# Patient Record
Sex: Female | Born: 1965 | Race: Black or African American | Hispanic: No | Marital: Single | Smoking: Former smoker
Health system: Southern US, Community
[De-identification: ages and names within clinical notes are randomized; demographics above are authoritative.]

## PROBLEM LIST (undated history)

## (undated) DIAGNOSIS — G8929 Other chronic pain: Secondary | ICD-10-CM

## (undated) DIAGNOSIS — M25569 Pain in unspecified knee: Secondary | ICD-10-CM

## (undated) DIAGNOSIS — M1990 Unspecified osteoarthritis, unspecified site: Secondary | ICD-10-CM

## (undated) DIAGNOSIS — G43909 Migraine, unspecified, not intractable, without status migrainosus: Secondary | ICD-10-CM

## (undated) DIAGNOSIS — M549 Dorsalgia, unspecified: Secondary | ICD-10-CM

## (undated) DIAGNOSIS — M542 Cervicalgia: Secondary | ICD-10-CM

## (undated) DIAGNOSIS — M5430 Sciatica, unspecified side: Secondary | ICD-10-CM

## (undated) DIAGNOSIS — M25519 Pain in unspecified shoulder: Secondary | ICD-10-CM

## (undated) HISTORY — PX: LESION EXCISION: SHX5167

## (undated) HISTORY — PX: TUBAL LIGATION: SHX77

---

## 2005-02-15 ENCOUNTER — Emergency Department (HOSPITAL_COMMUNITY): Admission: EM | Admit: 2005-02-15 | Discharge: 2005-02-15 | Payer: Self-pay | Admitting: Emergency Medicine

## 2005-08-25 ENCOUNTER — Emergency Department (HOSPITAL_COMMUNITY): Admission: EM | Admit: 2005-08-25 | Discharge: 2005-08-26 | Payer: Self-pay | Admitting: Emergency Medicine

## 2006-01-01 ENCOUNTER — Emergency Department (HOSPITAL_COMMUNITY): Admission: EM | Admit: 2006-01-01 | Discharge: 2006-01-01 | Payer: Self-pay | Admitting: Emergency Medicine

## 2006-04-02 ENCOUNTER — Emergency Department (HOSPITAL_COMMUNITY): Admission: EM | Admit: 2006-04-02 | Discharge: 2006-04-02 | Payer: Self-pay | Admitting: Emergency Medicine

## 2006-04-04 ENCOUNTER — Emergency Department (HOSPITAL_COMMUNITY): Admission: EM | Admit: 2006-04-04 | Discharge: 2006-04-04 | Payer: Self-pay | Admitting: Emergency Medicine

## 2007-01-10 ENCOUNTER — Emergency Department (HOSPITAL_COMMUNITY): Admission: EM | Admit: 2007-01-10 | Discharge: 2007-01-10 | Payer: Self-pay | Admitting: Emergency Medicine

## 2007-06-05 ENCOUNTER — Other Ambulatory Visit: Admission: RE | Admit: 2007-06-05 | Discharge: 2007-06-05 | Payer: Self-pay | Admitting: Pediatrics

## 2007-06-22 ENCOUNTER — Encounter: Admission: RE | Admit: 2007-06-22 | Discharge: 2007-06-22 | Payer: Self-pay | Admitting: Family Medicine

## 2007-10-05 ENCOUNTER — Emergency Department (HOSPITAL_COMMUNITY): Admission: EM | Admit: 2007-10-05 | Discharge: 2007-10-06 | Payer: Self-pay | Admitting: Emergency Medicine

## 2007-10-23 ENCOUNTER — Encounter: Admission: RE | Admit: 2007-10-23 | Discharge: 2007-10-23 | Payer: Self-pay | Admitting: Otolaryngology

## 2008-04-28 ENCOUNTER — Emergency Department (HOSPITAL_COMMUNITY): Admission: EM | Admit: 2008-04-28 | Discharge: 2008-04-29 | Payer: Self-pay | Admitting: Emergency Medicine

## 2008-07-14 ENCOUNTER — Emergency Department (HOSPITAL_COMMUNITY): Admission: EM | Admit: 2008-07-14 | Discharge: 2008-07-14 | Payer: Self-pay | Admitting: Emergency Medicine

## 2008-09-25 ENCOUNTER — Emergency Department (HOSPITAL_COMMUNITY): Admission: EM | Admit: 2008-09-25 | Discharge: 2008-09-25 | Payer: Self-pay | Admitting: Emergency Medicine

## 2008-12-06 ENCOUNTER — Ambulatory Visit: Payer: Self-pay | Admitting: Family Medicine

## 2009-04-24 ENCOUNTER — Encounter: Admission: RE | Admit: 2009-04-24 | Discharge: 2009-04-24 | Payer: Self-pay | Admitting: Internal Medicine

## 2009-05-07 ENCOUNTER — Ambulatory Visit (HOSPITAL_COMMUNITY): Admission: RE | Admit: 2009-05-07 | Discharge: 2009-05-07 | Payer: Self-pay | Admitting: Internal Medicine

## 2010-04-05 ENCOUNTER — Encounter: Payer: Self-pay | Admitting: Internal Medicine

## 2010-04-08 ENCOUNTER — Other Ambulatory Visit: Payer: Self-pay | Admitting: Internal Medicine

## 2010-04-08 DIAGNOSIS — Z1239 Encounter for other screening for malignant neoplasm of breast: Secondary | ICD-10-CM

## 2010-04-28 ENCOUNTER — Ambulatory Visit: Payer: Self-pay

## 2010-05-12 ENCOUNTER — Ambulatory Visit
Admission: RE | Admit: 2010-05-12 | Discharge: 2010-05-12 | Disposition: A | Discharge status: Home / Self Care | Payer: BC Managed Care – PPO | Source: Ambulatory Visit | Attending: Internal Medicine | Admitting: Internal Medicine

## 2010-05-12 DIAGNOSIS — Z1239 Encounter for other screening for malignant neoplasm of breast: Secondary | ICD-10-CM

## 2010-06-21 LAB — URINALYSIS, ROUTINE W REFLEX MICROSCOPIC
Bilirubin Urine: NEGATIVE
Hgb urine dipstick: NEGATIVE
Specific Gravity, Urine: 1.02 (ref 1.005–1.030)
pH: 6 (ref 5.0–8.0)

## 2010-06-21 LAB — WET PREP, GENITAL: Yeast Wet Prep HPF POC: NONE SEEN

## 2010-06-21 LAB — CBC
HCT: 37.1 % (ref 36.0–46.0)
Hemoglobin: 12.2 g/dL (ref 12.0–15.0)
MCV: 81.1 fL (ref 78.0–100.0)
RBC: 4.58 MIL/uL (ref 3.87–5.11)
WBC: 3.6 10*3/uL — ABNORMAL LOW (ref 4.0–10.5)

## 2011-05-05 ENCOUNTER — Other Ambulatory Visit: Payer: Self-pay | Admitting: Internal Medicine

## 2011-05-05 DIAGNOSIS — Z1231 Encounter for screening mammogram for malignant neoplasm of breast: Secondary | ICD-10-CM

## 2011-05-12 ENCOUNTER — Ambulatory Visit
Admission: RE | Admit: 2011-05-12 | Discharge: 2011-05-12 | Disposition: A | Discharge status: Home / Self Care | Payer: BC Managed Care – PPO | Source: Ambulatory Visit | Attending: Internal Medicine | Admitting: Internal Medicine

## 2011-05-12 DIAGNOSIS — Z1231 Encounter for screening mammogram for malignant neoplasm of breast: Secondary | ICD-10-CM

## 2011-06-03 ENCOUNTER — Other Ambulatory Visit: Payer: Self-pay | Admitting: Orthopedic Surgery

## 2011-06-03 DIAGNOSIS — M25512 Pain in left shoulder: Secondary | ICD-10-CM

## 2011-06-11 ENCOUNTER — Inpatient Hospital Stay: Admission: RE | Admit: 2011-06-11 | Payer: BC Managed Care – PPO | Source: Ambulatory Visit

## 2011-06-26 ENCOUNTER — Other Ambulatory Visit: Payer: BC Managed Care – PPO

## 2012-05-12 ENCOUNTER — Other Ambulatory Visit: Payer: Self-pay

## 2012-05-12 DIAGNOSIS — Z1231 Encounter for screening mammogram for malignant neoplasm of breast: Secondary | ICD-10-CM

## 2012-05-15 ENCOUNTER — Ambulatory Visit: Payer: BC Managed Care – PPO

## 2012-05-16 ENCOUNTER — Ambulatory Visit: Payer: BC Managed Care – PPO

## 2012-05-29 ENCOUNTER — Ambulatory Visit: Payer: BC Managed Care – PPO

## 2012-06-02 ENCOUNTER — Ambulatory Visit (HOSPITAL_BASED_OUTPATIENT_CLINIC_OR_DEPARTMENT_OTHER): Payer: Medicaid Other | Attending: Internal Medicine

## 2012-06-02 ENCOUNTER — Ambulatory Visit
Admission: RE | Admit: 2012-06-02 | Discharge: 2012-06-02 | Disposition: A | Discharge status: Home / Self Care | Payer: Medicaid Other | Source: Ambulatory Visit | Attending: Internal Medicine | Admitting: Internal Medicine

## 2012-06-02 ENCOUNTER — Other Ambulatory Visit: Payer: Self-pay | Admitting: Family

## 2012-06-02 VITALS — Ht 65.0 in | Wt 220.0 lb

## 2012-06-02 DIAGNOSIS — R0989 Other specified symptoms and signs involving the circulatory and respiratory systems: Secondary | ICD-10-CM | POA: Insufficient documentation

## 2012-06-02 DIAGNOSIS — G4733 Obstructive sleep apnea (adult) (pediatric): Secondary | ICD-10-CM

## 2012-06-02 DIAGNOSIS — M25561 Pain in right knee: Secondary | ICD-10-CM

## 2012-06-02 DIAGNOSIS — G4710 Hypersomnia, unspecified: Secondary | ICD-10-CM | POA: Insufficient documentation

## 2012-06-02 DIAGNOSIS — R0609 Other forms of dyspnea: Secondary | ICD-10-CM | POA: Insufficient documentation

## 2012-06-02 DIAGNOSIS — G4730 Sleep apnea, unspecified: Secondary | ICD-10-CM | POA: Insufficient documentation

## 2012-06-06 NOTE — Procedures (Signed)
[name], Kayla [name]                 ACCOUNT NO.:  [id number]  MEDICAL RECORD NO.:  [id number]          PATIENT TYPE:  OUT  LOCATION:  SLEEP CENTER                 FACILITY:  [facility]  PHYSICIAN:  Khayree Delellis D. Maple Hudson, MD, FCCP, FACPDATE OF BIRTH:  01-16-67  DATE OF STUDY:  06/02/2012                           NOCTURNAL POLYSOMNOGRAM  REFERRING PHYSICIAN:  Merlene Laughter. Renae Gloss, M.D.  INDICATION FOR STUDY:  Hypersomnia with sleep apnea.  EPWORTH SLEEPINESS SCORE:  19/24.  BMI 36.6 per hour.  Weight 220 pounds.  Height 65 inches.  Neck 15.5 inches.  MEDICATIONS:  Home medications charted and reviewed.  SLEEP ARCHITECTURE:  Total sleep time 271 minutes with sleep efficiency 74.2%.  Stage I was 13.1%, stage II 67.2%, stage III 1.8%, REM 17.9% of total sleep time.  Sleep latency 25 minutes.  REM latency 71.5 minutes, awake after sleep onset 71 minutes.  Arousal index 12.6.  BEDTIME MEDICATION:  None.  RESPIRATORY DATA:  Apnea-hypopnea index (AHI) 3.5 per hour.  A total of 16 events was scored including 7 obstructive apneas, 2 central apneas, 7 hypopneas.  Events were not positional.  REM-AHI 18.6 per hour.  There were not enough events to qualify for split CPAP titration protocol on this study night.  OXYGEN DATA:  Moderate snoring with oxygen desaturation to a nadir of 89% and mean oxygen saturation through the study of 97.2% on room air.  CARDIAC DATA:  Normal sinus rhythm.  MOVEMENT-PARASOMNIA:  No significant movement disturbance.  Bathroom x2.  IMPRESSIONS-RECOMMENDATIONS: 1. Unremarkable sleep architecture for sleep center environment. 2. Occasional respiratory events with sleep disturbance, within normal     limits.  AHI 3.5 per hour (the normal range for adults is from 0-5     per hour).  Moderate snoring with oxygen desaturation to a nadir of     89% and     mean oxygen saturation through the study of 97.2% on room air.     There were not enough events to meet protocol  requirements for CPAP     trial.     Landrey Mahurin D. Maple Hudson, MD, [facility], FACP Diplomate, American Board of Sleep Medicine    CDY/MEDQ  D:  06/04/2012 12:56:55  T:  06/04/2012 17:17:05  Job:  [id number]

## 2012-07-04 ENCOUNTER — Ambulatory Visit
Admission: RE | Admit: 2012-07-04 | Discharge: 2012-07-04 | Disposition: A | Discharge status: Home / Self Care | Payer: Medicaid Other | Source: Ambulatory Visit

## 2012-07-04 DIAGNOSIS — Z1231 Encounter for screening mammogram for malignant neoplasm of breast: Secondary | ICD-10-CM

## 2012-07-24 ENCOUNTER — Ambulatory Visit: Payer: Medicaid Other

## 2012-08-15 ENCOUNTER — Other Ambulatory Visit: Payer: Self-pay | Admitting: Gastroenterology

## 2012-08-15 DIAGNOSIS — R109 Unspecified abdominal pain: Secondary | ICD-10-CM

## 2012-08-15 DIAGNOSIS — R112 Nausea with vomiting, unspecified: Secondary | ICD-10-CM

## 2012-08-28 ENCOUNTER — Encounter (HOSPITAL_COMMUNITY): Payer: Medicaid Other

## 2012-08-29 ENCOUNTER — Ambulatory Visit (HOSPITAL_COMMUNITY)
Admission: RE | Admit: 2012-08-29 | Discharge: 2012-08-29 | Disposition: A | Discharge status: Home / Self Care | Payer: Medicaid Other | Source: Ambulatory Visit | Attending: Gastroenterology | Admitting: Gastroenterology

## 2012-08-29 ENCOUNTER — Encounter (HOSPITAL_COMMUNITY): Payer: Medicaid Other

## 2012-08-29 DIAGNOSIS — R109 Unspecified abdominal pain: Secondary | ICD-10-CM

## 2012-08-29 DIAGNOSIS — R112 Nausea with vomiting, unspecified: Secondary | ICD-10-CM

## 2012-09-06 ENCOUNTER — Encounter (HOSPITAL_COMMUNITY)
Admission: RE | Admit: 2012-09-06 | Discharge: 2012-09-06 | Disposition: A | Discharge status: Home / Self Care | Payer: Medicaid Other | Source: Ambulatory Visit | Attending: Gastroenterology | Admitting: Gastroenterology

## 2012-09-06 ENCOUNTER — Ambulatory Visit (HOSPITAL_COMMUNITY)
Admission: RE | Admit: 2012-09-06 | Discharge: 2012-09-06 | Disposition: A | Discharge status: Home / Self Care | Payer: Medicaid Other | Source: Ambulatory Visit | Attending: Gastroenterology | Admitting: Gastroenterology

## 2012-09-06 DIAGNOSIS — R112 Nausea with vomiting, unspecified: Secondary | ICD-10-CM

## 2012-09-06 DIAGNOSIS — R109 Unspecified abdominal pain: Secondary | ICD-10-CM | POA: Insufficient documentation

## 2012-09-06 DIAGNOSIS — N289 Disorder of kidney and ureter, unspecified: Secondary | ICD-10-CM | POA: Insufficient documentation

## 2012-09-06 MED ORDER — SINCALIDE 5 MCG IJ SOLR
INTRAMUSCULAR | Status: AC
  Filled 2012-09-06: qty 5

## 2012-09-06 MED ORDER — SINCALIDE 5 MCG IJ SOLR
0.0200 ug/kg | Freq: Once | INTRAMUSCULAR | Status: AC
  Administered 2012-09-06: 1.97 ug via INTRAVENOUS

## 2012-09-06 MED ORDER — TECHNETIUM TC 99M MEBROFENIN IV KIT
5.0000 | PACK | Freq: Once | INTRAVENOUS | Status: AC | PRN
  Administered 2012-09-06: 5 via INTRAVENOUS

## 2012-09-15 ENCOUNTER — Encounter (HOSPITAL_COMMUNITY): Payer: Self-pay | Admitting: *Deleted

## 2012-09-15 ENCOUNTER — Emergency Department (HOSPITAL_COMMUNITY)
Admission: EM | Admit: 2012-09-15 | Discharge: 2012-09-15 | Discharge status: Against Medical Advice | Payer: Medicaid Other | Attending: Emergency Medicine | Admitting: Emergency Medicine

## 2012-09-15 DIAGNOSIS — M25519 Pain in unspecified shoulder: Secondary | ICD-10-CM | POA: Insufficient documentation

## 2012-09-15 DIAGNOSIS — M542 Cervicalgia: Secondary | ICD-10-CM | POA: Insufficient documentation

## 2012-09-15 DIAGNOSIS — M25529 Pain in unspecified elbow: Secondary | ICD-10-CM | POA: Insufficient documentation

## 2012-09-15 DIAGNOSIS — G8929 Other chronic pain: Secondary | ICD-10-CM | POA: Insufficient documentation

## 2012-09-15 HISTORY — DX: Other chronic pain: G89.29

## 2012-09-15 HISTORY — DX: Unspecified osteoarthritis, unspecified site: M19.90

## 2012-09-15 HISTORY — DX: Migraine, unspecified, not intractable, without status migrainosus: G43.909

## 2012-09-15 HISTORY — DX: Pain in unspecified knee: M25.569

## 2012-09-15 HISTORY — DX: Pain in unspecified shoulder: M25.519

## 2012-09-15 NOTE — ED Notes (Signed)
Pt states has chronic pain in shoulders and arms, states past 3 days has had severe L shoulder/upper back area/arm pain 10/10.

## 2012-09-17 ENCOUNTER — Emergency Department (HOSPITAL_COMMUNITY)
Admission: EM | Admit: 2012-09-17 | Discharge: 2012-09-17 | Disposition: A | Discharge status: Home / Self Care | Payer: Medicaid Other | Attending: Emergency Medicine | Admitting: Emergency Medicine

## 2012-09-17 ENCOUNTER — Emergency Department (HOSPITAL_COMMUNITY): Payer: Medicaid Other

## 2012-09-17 ENCOUNTER — Encounter (HOSPITAL_COMMUNITY): Payer: Self-pay | Admitting: Emergency Medicine

## 2012-09-17 DIAGNOSIS — M25519 Pain in unspecified shoulder: Secondary | ICD-10-CM | POA: Insufficient documentation

## 2012-09-17 DIAGNOSIS — Z8679 Personal history of other diseases of the circulatory system: Secondary | ICD-10-CM | POA: Insufficient documentation

## 2012-09-17 DIAGNOSIS — G8929 Other chronic pain: Secondary | ICD-10-CM | POA: Insufficient documentation

## 2012-09-17 DIAGNOSIS — S5010XA Contusion of unspecified forearm, initial encounter: Secondary | ICD-10-CM | POA: Insufficient documentation

## 2012-09-17 DIAGNOSIS — X500XXA Overexertion from strenuous movement or load, initial encounter: Secondary | ICD-10-CM | POA: Insufficient documentation

## 2012-09-17 DIAGNOSIS — Z79899 Other long term (current) drug therapy: Secondary | ICD-10-CM | POA: Insufficient documentation

## 2012-09-17 DIAGNOSIS — Z8739 Personal history of other diseases of the musculoskeletal system and connective tissue: Secondary | ICD-10-CM | POA: Insufficient documentation

## 2012-09-17 DIAGNOSIS — M25512 Pain in left shoulder: Secondary | ICD-10-CM

## 2012-09-17 DIAGNOSIS — Y9389 Activity, other specified: Secondary | ICD-10-CM | POA: Insufficient documentation

## 2012-09-17 DIAGNOSIS — Y929 Unspecified place or not applicable: Secondary | ICD-10-CM | POA: Insufficient documentation

## 2012-09-17 DIAGNOSIS — IMO0002 Reserved for concepts with insufficient information to code with codable children: Secondary | ICD-10-CM | POA: Insufficient documentation

## 2012-09-17 DIAGNOSIS — M129 Arthropathy, unspecified: Secondary | ICD-10-CM | POA: Insufficient documentation

## 2012-09-17 DIAGNOSIS — T148XXA Other injury of unspecified body region, initial encounter: Secondary | ICD-10-CM

## 2012-09-17 DIAGNOSIS — R58 Hemorrhage, not elsewhere classified: Secondary | ICD-10-CM

## 2012-09-17 HISTORY — DX: Sciatica, unspecified side: M54.30

## 2012-09-17 HISTORY — DX: Cervicalgia: M54.2

## 2012-09-17 HISTORY — DX: Dorsalgia, unspecified: M54.9

## 2012-09-17 HISTORY — DX: Other chronic pain: G89.29

## 2012-09-17 MED ORDER — METHOCARBAMOL 500 MG PO TABS: 1000.0000 mg | ORAL_TABLET | Freq: Four times a day (QID) | ORAL | Status: DC | PRN

## 2012-09-17 MED ORDER — NAPROXEN 250 MG PO TABS: 250.0000 mg | ORAL_TABLET | Freq: Two times a day (BID) | ORAL | Status: DC

## 2012-09-17 MED ORDER — HYDROCODONE-ACETAMINOPHEN 5-325 MG PO TABS: ORAL_TABLET | ORAL | Status: DC

## 2012-09-17 MED ORDER — IBUPROFEN 200 MG PO TABS
400.0000 mg | ORAL_TABLET | Freq: Once | ORAL | Status: AC
  Administered 2012-09-17: 400 mg via ORAL
  Filled 2012-09-17: qty 2

## 2012-09-17 MED ORDER — HYDROCODONE-ACETAMINOPHEN 5-325 MG PO TABS
1.0000 | ORAL_TABLET | Freq: Once | ORAL | Status: AC
  Administered 2012-09-17: 1 via ORAL
  Filled 2012-09-17: qty 1
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
## 2012-10-06 ENCOUNTER — Other Ambulatory Visit: Payer: Self-pay | Admitting: Internal Medicine

## 2012-10-06 DIAGNOSIS — N289 Disorder of kidney and ureter, unspecified: Secondary | ICD-10-CM

## 2012-10-12 ENCOUNTER — Ambulatory Visit
Admission: RE | Admit: 2012-10-12 | Discharge: 2012-10-12 | Disposition: A | Discharge status: Home / Self Care | Payer: Medicaid Other | Source: Ambulatory Visit | Attending: Internal Medicine | Admitting: Internal Medicine

## 2012-10-12 DIAGNOSIS — N289 Disorder of kidney and ureter, unspecified: Secondary | ICD-10-CM

## 2012-10-12 MED ORDER — GADOBENATE DIMEGLUMINE 529 MG/ML IV SOLN
19.0000 mL | Freq: Once | INTRAVENOUS | Status: AC | PRN
  Administered 2012-10-12: 19 mL via INTRAVENOUS

## 2012-11-09 ENCOUNTER — Other Ambulatory Visit: Payer: Self-pay | Admitting: Gastroenterology

## 2012-11-09 DIAGNOSIS — R112 Nausea with vomiting, unspecified: Secondary | ICD-10-CM

## 2012-11-28 ENCOUNTER — Emergency Department (HOSPITAL_COMMUNITY): Payer: Medicaid Other

## 2012-11-28 ENCOUNTER — Emergency Department (HOSPITAL_COMMUNITY)
Admission: EM | Admit: 2012-11-28 | Discharge: 2012-11-28 | Disposition: A | Discharge status: Home / Self Care | Payer: Medicaid Other | Attending: Emergency Medicine | Admitting: Emergency Medicine

## 2012-11-28 ENCOUNTER — Encounter (HOSPITAL_COMMUNITY): Payer: Self-pay | Admitting: Emergency Medicine

## 2012-11-28 DIAGNOSIS — Z8679 Personal history of other diseases of the circulatory system: Secondary | ICD-10-CM | POA: Insufficient documentation

## 2012-11-28 DIAGNOSIS — Y929 Unspecified place or not applicable: Secondary | ICD-10-CM | POA: Insufficient documentation

## 2012-11-28 DIAGNOSIS — Y939 Activity, unspecified: Secondary | ICD-10-CM | POA: Insufficient documentation

## 2012-11-28 DIAGNOSIS — Z23 Encounter for immunization: Secondary | ICD-10-CM | POA: Insufficient documentation

## 2012-11-28 DIAGNOSIS — M129 Arthropathy, unspecified: Secondary | ICD-10-CM | POA: Insufficient documentation

## 2012-11-28 DIAGNOSIS — Z79899 Other long term (current) drug therapy: Secondary | ICD-10-CM | POA: Insufficient documentation

## 2012-11-28 DIAGNOSIS — S61209A Unspecified open wound of unspecified finger without damage to nail, initial encounter: Secondary | ICD-10-CM | POA: Insufficient documentation

## 2012-11-28 DIAGNOSIS — W540XXA Bitten by dog, initial encounter: Secondary | ICD-10-CM | POA: Insufficient documentation

## 2012-11-28 MED ORDER — TETANUS-DIPHTH-ACELL PERTUSSIS 5-2.5-18.5 LF-MCG/0.5 IM SUSP
0.5000 mL | Freq: Once | INTRAMUSCULAR | Status: AC
  Administered 2012-11-28: 0.5 mL via INTRAMUSCULAR
  Filled 2012-11-28: qty 0.5

## 2012-11-28 MED ORDER — AMOXICILLIN-POT CLAVULANATE 875-125 MG PO TABS: 1.0000 | ORAL_TABLET | Freq: Two times a day (BID) | ORAL | Status: DC

## 2012-11-28 NOTE — ED Notes (Signed)
Patient is alert and oriented x3.  She was given DC instructions and follow up visit instructions.  Patient gave verbal understanding. She was DC ambulatory under her own power to home.  V/S stable.  He was not showing any signs of distress on DC 

## 2012-11-28 NOTE — ED Provider Notes (Signed)
CSN: [id number]     Arrival date & time 11/28/12  1753 History  This chart was scribed for non-physician practitioner, Magnus Sinning, PA-C working with Junius Argyle, MD by Greggory Stallion, ED scribe. This patient was seen in room WTR5/WTR5 and the patient's care was started at 7:25 PM.   Chief Complaint  Patient presents with  . Animal Bite   The history is provided by the patient. No language interpreter was used.    HPI Comments: Kayla [name] is a 47 y.o. female who presents to the Emergency Department complaining of a dog bite to her right middle and index fingers that happened about an hour prior to arrival. She reports that she washed the area with soap and water.  She also washed the area with Hydrogen Peroxide.  She states she also fell trying to get away from the dog and bent her right thumb backwards. She states the dog is her's and it's shots are UTD. She denies any other associated symptoms. Pt states her last tetanus was 25 years ago.   Past Medical History  Diagnosis Date  . Chronic shoulder pain     left  . Arthritis   . Migraine   . Knee pain   . Chronic neck pain   . Chronic back pain   . Sciatica    Past Surgical History  Procedure Laterality Date  . Cesarean section    . Tubal ligation     No family history on file. History  Substance Use Topics  . Smoking status: Never Smoker   . Smokeless tobacco: Never Used  . Alcohol Use: Yes   OB History   Grav Para Term Preterm Abortions TAB SAB Ect Mult Living                 Review of Systems  Skin: Positive for wound.  All other systems reviewed and are negative.    Allergies  Oxycodone  Home Medications   Current Outpatient Rx  Name  Route  Sig  Dispense  Refill  . acetaminophen (TYLENOL) 500 MG tablet   Oral   Take 500 mg by mouth every 6 (six) hours as needed for pain.         . cetirizine (ZYRTEC) 10 MG tablet   Oral   Take 10 mg by mouth daily.         . cholecalciferol (VITAMIN  D) 1000 UNITS tablet   Oral   Take 1,000 Units by mouth daily.         . DULoxetine (CYMBALTA) 30 MG capsule   Oral   Take 30 mg by mouth daily.         Marland Kitchen HYDROcodone-acetaminophen (NORCO/VICODIN) 5-325 MG per tablet      1 or 2 tabs PO q6 hours prn pain   20 tablet   0   . methocarbamol (ROBAXIN) 500 MG tablet   Oral   Take 2 tablets (1,000 mg total) by mouth 4 (four) times daily as needed (muscle spasm/pain).   25 tablet   0   . naproxen (NAPROSYN) 250 MG tablet   Oral   Take 1 tablet (250 mg total) by mouth 2 (two) times daily with a meal.   14 tablet   0   . NON FORMULARY   Subcutaneous   Inject 2 vials into the skin 3 (three) times a week. Allergy shots          BP 125/83  Pulse 72  Temp(Src)  98.5 F (36.9 C) (Oral)  Resp 20  SpO2 98%  Physical Exam  Nursing note and vitals reviewed. Constitutional: She appears well-developed and well-nourished.  HENT:  Head: Normocephalic and atraumatic.  Mouth/Throat: Oropharynx is clear and moist.  Eyes: EOM are normal. Pupils are equal, round, and reactive to light.  Neck: Normal range of motion. Neck supple.  Cardiovascular: Normal rate, regular rhythm and normal heart sounds.   2+ radial pulse.   Pulmonary/Chest: Effort normal and breath sounds normal. No respiratory distress. She has no wheezes. She has no rales.  Musculoskeletal: Normal range of motion.  Full ROM of fingers on both hands.   Neurological: She is alert.  Sensation intact.   Skin: Skin is warm and dry.  Superficial puncture wounds to right middle finger. No surrounding erythema or edema. No drainage at this time. Bleeding is controlled at this time.   Psychiatric: She has a normal mood and affect. Her behavior is normal.    ED Course  Procedures (including critical care time)  DIAGNOSTIC STUDIES: Oxygen Saturation is 98% on RA, normal by my interpretation.    COORDINATION OF CARE: 7:29 PM-Discussed treatment plan which includes updating  tetanus and antibiotic with pt at bedside and pt agreed to plan.   Labs Review Labs Reviewed - No data to display Imaging Review Dg Hand Complete Left  11/28/2012   CLINICAL DATA:  History of fall complaining of left hand pain.  EXAM: LEFT HAND - COMPLETE 3+ VIEW  COMPARISON:  None.  FINDINGS: Three views of the left hand demonstrate no acute displaced fracture, subluxation, dislocation, joint or soft tissue abnormality.  IMPRESSION: No acute radiographic abnormality of the left hand.   Electronically Signed   By: Trudie Reed M.D.   On: 11/28/2012 19:30    MDM  No diagnosis found. Patient presenting with dog bite of the left hand.  Patient with superficial puncture wounds.  Areas cleaned well.  Tetanus updated.  Xray negative.  Patient reports that the dog's immunizations are UTD.  Patient stable for discharge.  Patient started on Augmentin to prevent infection.  Return precautions given.  I personally performed the services described in this documentation, which was scribed in my presence. The recorded information has been reviewed and is accurate.    Pascal Lux [place], PA-C 11/28/12 2004

## 2012-11-28 NOTE — ED Notes (Signed)
Pt states she was bite by her dog on right middle finger and left index finger and also fell trying to get away from dog and bent right thumb.

## 2012-11-29 NOTE — ED Provider Notes (Signed)
Medical screening examination/treatment/procedure(s) were performed by non-physician practitioner and as supervising physician I was immediately available for consultation/collaboration.   Junius Argyle, MD 11/29/12 1224

## 2012-11-30 ENCOUNTER — Ambulatory Visit (HOSPITAL_COMMUNITY)
Admission: RE | Admit: 2012-11-30 | Discharge: 2012-11-30 | Disposition: A | Discharge status: Home / Self Care | Payer: Medicaid Other | Source: Ambulatory Visit | Attending: Gastroenterology | Admitting: Gastroenterology

## 2012-11-30 DIAGNOSIS — R141 Gas pain: Secondary | ICD-10-CM | POA: Insufficient documentation

## 2012-11-30 DIAGNOSIS — R6881 Early satiety: Secondary | ICD-10-CM | POA: Insufficient documentation

## 2012-11-30 DIAGNOSIS — K3184 Gastroparesis: Secondary | ICD-10-CM | POA: Insufficient documentation

## 2012-11-30 DIAGNOSIS — K219 Gastro-esophageal reflux disease without esophagitis: Secondary | ICD-10-CM | POA: Insufficient documentation

## 2012-11-30 DIAGNOSIS — R142 Eructation: Secondary | ICD-10-CM | POA: Insufficient documentation

## 2012-11-30 DIAGNOSIS — R112 Nausea with vomiting, unspecified: Secondary | ICD-10-CM | POA: Insufficient documentation

## 2012-11-30 MED ORDER — TECHNETIUM TC 99M SULFUR COLLOID
2.1000 | Freq: Once | INTRAVENOUS | Status: AC | PRN
  Administered 2012-11-30: 2.1 via INTRAVENOUS

## 2013-01-24 ENCOUNTER — Ambulatory Visit (INDEPENDENT_AMBULATORY_CARE_PROVIDER_SITE_OTHER): Payer: Medicaid Other

## 2013-01-24 VITALS — BP 124/73 | HR 71 | Resp 16 | Ht 65.0 in | Wt 210.0 lb

## 2013-01-24 DIAGNOSIS — M7751 Other enthesopathy of right foot: Secondary | ICD-10-CM

## 2013-01-24 DIAGNOSIS — R52 Pain, unspecified: Secondary | ICD-10-CM

## 2013-01-24 DIAGNOSIS — B070 Plantar wart: Secondary | ICD-10-CM

## 2013-01-24 DIAGNOSIS — M7750 Other enthesopathy of unspecified foot: Secondary | ICD-10-CM

## 2013-01-24 DIAGNOSIS — D492 Neoplasm of unspecified behavior of bone, soft tissue, and skin: Secondary | ICD-10-CM

## 2013-01-24 DIAGNOSIS — Q828 Other specified congenital malformations of skin: Secondary | ICD-10-CM

## 2013-01-24 NOTE — Patient Instructions (Signed)
Warts Warts are a common viral infection. They are most commonly caused by the human papillomavirus (HPV). Warts can occur at all ages. However, they occur most frequently in older children and infrequently in the elderly. Warts may be single or multiple. Location and size varies. Warts can be spread by scratching the wart and then scratching normal skin. The life cycle of warts varies. However, most will disappear over many months to a couple years. Warts commonly do not cause problems (asymptomatic) unless they are over an area of pressure, such as the bottom of the foot. If they are large enough, they may cause pain with walking. DIAGNOSIS  Warts are most commonly diagnosed by their appearance. Tissue samples (biopsies) are not required unless the wart looks abnormal. Most warts have a rough surface, are round, oval, or irregular, and are skin-colored to light yellow, brown, or gray. They are generally less than  inch (1.3 cm), but they can be any size. TREATMENT   Observation or no treatment.  Freezing with liquid nitrogen.  High heat (cautery).  Boosting the body's immunity to fight off the wart (immunotherapy using Candida antigen).  Laser surgery.  Application of various irritants and solutions. HOME CARE INSTRUCTIONS  Follow your caregiver's instructions. No special precautions are necessary. Often, treatment may be followed by a return (recurrence) of warts. Warts are generally difficult to treat and get rid of. If treatment is done in a clinic setting, usually more than 1 treatment is required. This is usually done on only a monthly basis until the wart is completely gone. SEEK IMMEDIATE MEDICAL CARE IF: The treated skin becomes red, puffy (swollen), or painful. Document Released: 12/09/2004 Document Revised: 06/26/2012 Document Reviewed: 06/06/2009 [employer] Patient Information 2014 [employer].  

## 2013-01-24 NOTE — Progress Notes (Signed)
Subjective:    Patient ID: Kayla [name], female    DOB: [birth year], 47 y.o.   MRN: [id number] "I have pain on my right foot on the side.  I think it's a spur.  I have spots on the bottom of my foot.  I have this place on the bottom of my left foot that bothers me.  It has a core in it."   Toe Pain  The incident occurred more than 1 week ago. There was no injury mechanism. The pain is present in the right foot. The quality of the pain is described as burning and aching. The pain is at a severity of 7/10. The pain is moderate. The pain has been intermittent since onset. Associated symptoms include an inability to bear weight. She reports no foreign bodies present. The symptoms are aggravated by weight bearing (shoes). She has tried acetaminophen for the symptoms. The treatment provided no relief.      Review of Systems  Constitutional: Positive for activity change, appetite change and unexpected weight change.  HENT: Positive for postnasal drip, sinus pressure and sneezing.   Eyes: Negative.   Respiratory: Positive for apnea.   Cardiovascular: Negative.   Gastrointestinal: Positive for nausea, vomiting, abdominal pain, diarrhea, constipation and abdominal distention.  Endocrine: Positive for cold intolerance.  Genitourinary: Negative.   Musculoskeletal: Positive for neck pain and neck stiffness.  Skin: Negative.   Allergic/Immunologic: Positive for environmental allergies.  Neurological: Positive for headaches.  Hematological: Bruises/bleeds easily.  Psychiatric/Behavioral: Negative.        Objective:   Physical Exam Neurovascular status is intact with pedal pulses palpable DP +2/4 PT +2/4 bilateral. Skin temperature warm turgor normal no edema rubor pallor or varicosities noted. Capillary refill time 3 seconds all digits. Patient does have some digital contractures hammertoes 2 through 5 bilateral orthopedic biomechanical exam reveals some digital contractures patient does have on x-rays  right foot hallux is rectus sesamoid position 0 no spurring slight inferior calcaneal spur noted no retrocalcaneal spurring lesser digits show adductovarus rotated contractures there is some calcifications in the anterior shin on the right about 3 inches above the ankle mortise. No signs of fracture or other osseous abnormalities noted. A dermatologic exam patient has a nucleated keratotic lesion mid arch of left foot consistent with a verruca plantaris or poor keratoses. Painful on direct lateral compression. No discharge or drainage is noted. On the right foot patient is concerned about multiple pigmented lesions measuring from 2 mm to greater than a centimeter in diameter inferior right forefoot at the second third and fourth metatarsal areas. A small lesion also noted on the third digit distal tuft as well. Patient cases lesions were unnoticed prior to several months ago. Patient cases they are not long-standing birthmarks. Patient expresses concern about him being melanoma type or cancer lesions. Per patient request my recommendation at this time we'll arrange for excision and biopsy.       Assessment & Plan:  Assessment this time is verruca plantaris left foot patient is dispensed written instructions for topical salicylic acid application under occlusion utilizing.tape apply a salicylic acid daily for 7-14 day regimen.  Assessment on the right foot is possible unspecified neoplasm of skin cannot rule out melanoma at this time. The lesions have likely been there less than 6 months or year patient is uncertain of any changes. At this time consent form was reviewed for excision of skin lesions for biopsy purposes will do either excisional or incisional biopsies at least 3 of  the lesions mentioned. Patient is amendable to these understands that we'll do an excision with possible closure on the largest lesion and possible punch biopsy on the distal tuft of the third digit right foot. The procedural be done  in the office under local anesthetic block within the next week. All questions asked medication are answered or no contraindications to the procedure as patient is intact neurovascular status in the biopsies are scheduled.  Alvan Dame DPM

## 2013-01-29 ENCOUNTER — Ambulatory Visit (INDEPENDENT_AMBULATORY_CARE_PROVIDER_SITE_OTHER): Payer: Medicaid Other

## 2013-01-29 DIAGNOSIS — D492 Neoplasm of unspecified behavior of bone, soft tissue, and skin: Secondary | ICD-10-CM

## 2013-01-29 NOTE — Progress Notes (Signed)
Subjective:    Patient ID: Kayla [name], female    DOB: 08-Nov-1965, 47 y.o.   MRN: [id number]  HPI patient presents for surgical excision of 3 suspect pigmented unspecified neoplasms of skin right foot    Review of Systems  Constitutional: Negative.   HENT: Negative.   Eyes: Negative.   Respiratory: Negative.   Cardiovascular: Negative.   Genitourinary: Negative.   Musculoskeletal: Negative.   Skin: Negative.   Allergic/Immunologic: Negative.   Neurological: Negative.   Hematological: Negative.   Psychiatric/Behavioral: Negative.   All other systems reviewed and are negative.   deferred at this time     Objective:   Physical Exam  Constitutional: She appears well-developed and well-nourished.  Cardiovascular:  Pulses:      Dorsalis pedis pulses are 2+ on the right side, and 2+ on the left side.       Posterior tibial pulses are 2+ on the left side.  Capillary refill 3 seconds all digits. Skin temperature warm no edema rubor pallor or varicosities noted to  Musculoskeletal: Normal range of motion.  Rectus foot type orthopedic biomechanical exam unremarkable noncontributory  Neurological: She is alert. She has normal reflexes.  Epicritic and proprioceptive sensations intact and symmetric bilateral normal plantar response and DTRs noted  Skin: Skin is warm and dry. No cyanosis. Nails show no clubbing.  Skin color pigment normal hair growth absent.  3 suspect lesions and she several small lesions also present plantar aspect right foot lesion been present for a period of posse less than 6 months they show irregular borders and shakes with very shades of hyperpigmentation the largest lesion sub-third metatarsal plantar forefoot a second lesion is subsecond metatarsal area. Just proximal to the digital sulcus. The third lesion is on the distal tuft of the third digit less than 2 mm in diameter the largest lesion is approximately a centimeter or more in length and a half centimeter in  width. Lesions are not painful not raised harm in present for less than six-month and are of concern to the patient to  Psychiatric: She has a normal mood and affect. Her behavior is normal.          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 Total of 3 lesions will be submitted for pathology Anesthesia: Local anesthetic block total of 3 cc 50-50 mixture 2% Xylocaine plain/0.5% Marcaine with epi 1-100,000. Hemostasis: Appendectomy with local and right ankle tourniquet at 250 mm mercury Estimated blood loss: Minimal less than 1 cc Indications for surgery: Patient has proximally 6 months or less history of new pigmented lesions plantar aspect of right foot least 3 a larger or more notable lesions are to be biopsied at this time due to patient's concern for melanoma. The lesions have not been noted prior to 6 months ago and apparently have grown, or possibly changed color. This certainly makes him suspicious for melanoma. Findings and procedures: Patient was brought to the OR and placed on table in supine position. In the preop treatment room local anesthetic block was listed utilizing total of 3 cc of the 50-50 mixture of local block. Once on the table the patient was prepped and draped in usual aseptic manner Betadine prep was performed the right foot was exsanguinated via an Ace wrap. Ankle tourniquet  was inflated to 250 mm mercury and the following procedures were then carried out.  Procedure #1 Excision of lesion sub-third right.  Attention was directed the plantar aspect of the right foot sub-third metatarsal area. The greater than 1 cm by half centimeter lesion was identified and 2 somewhat semi-elliptical longitudinal  incisions were made encompassing the lesions. Lesions were made down to subcutaneous tissue level and the incision and lesion were excised from the site in toto. The specimen was submitted in formalin for pathology. The base of lesion was cleared of any fatty necrotic tissue no pigmentation extended down to subcutaneous level. The edges were undermined and closure was accomplished utilizing 5-0 nylon in a simple interrupted fashion. Procedure #2:3 mm punch biopsy subsecond met right  Attention was made to the pigmented lesion beneath the second metatarsal area. The lesion has irregular border with varying shades of pigmentation. At this time a 3 mm punch biopsy was utilized and full thickness epidermal dermal sample was obtained from an area of the periphery of the lesion. This was also submitted in formalin for pathology analysis. A single suture was utilized to close the punch biopsy site. Procedure #3: 3 mm punch biopsy third toe right  Attention was directed to the plantar distal tuft third digit right foot where approximately 2 mm pigmented lesion was identified. The punch biopsy 3 mm was utilized to obtain a full-thickness sample epidermal and dermal of this lesion. Once excised a 5-0 nylon suture was utilized to close lesion.  Upon completion of all 3 excisions/biopsies. The sites were prepped with Betadine saline sponge and a dry sterile gauze dressing then applied to the right foot. Ankle tourniquet was deflated with immediate return of perfusion to all digits. Patient was discharged with [facility] postop instructions. Prescription for pain antibiotic medication and appointment for followup offices within one week. Lesions were submitted to be collapse for pathology analysis.  Alvan Dame DPM

## 2013-01-29 NOTE — Patient Instructions (Signed)

## 2013-02-06 ENCOUNTER — Ambulatory Visit (INDEPENDENT_AMBULATORY_CARE_PROVIDER_SITE_OTHER): Payer: Medicaid Other

## 2013-02-06 VITALS — BP 110/65 | HR 67 | Temp 98.6°F | Resp 20

## 2013-02-06 DIAGNOSIS — D229 Melanocytic nevi, unspecified: Secondary | ICD-10-CM

## 2013-02-06 DIAGNOSIS — Z09 Encounter for follow-up examination after completed treatment for conditions other than malignant neoplasm: Secondary | ICD-10-CM

## 2013-02-06 DIAGNOSIS — D239 Other benign neoplasm of skin, unspecified: Secondary | ICD-10-CM

## 2013-02-06 DIAGNOSIS — D492 Neoplasm of unspecified behavior of bone, soft tissue, and skin: Secondary | ICD-10-CM

## 2013-02-06 NOTE — Progress Notes (Signed)
  Subjective:    Patient ID: Kayla [name], female    DOB: 01-09-1966, 47 y.o.   MRN: [id number] "It's doing alright."  HPI    Review of Systems deferred     Objective:   Physical Exam Patient presents this time to 8 day status post excision biopsy 3 lesions plantar right foot. 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 Patient having minimal discomfort dressings intact and dry.       Assessment & Plan:  Assessment good postop progress all 3 lesions were noted to be benign. Presto compressive dressing reapplied at this time reappointed one week plan for stitch removal at next visit.  Alvan Dame DP

## 2013-02-06 NOTE — Patient Instructions (Signed)

## 2013-02-13 ENCOUNTER — Ambulatory Visit (INDEPENDENT_AMBULATORY_CARE_PROVIDER_SITE_OTHER): Payer: Medicaid Other

## 2013-02-13 VITALS — BP 124/74 | HR 64 | Resp 12

## 2013-02-13 DIAGNOSIS — D239 Other benign neoplasm of skin, unspecified: Secondary | ICD-10-CM

## 2013-02-13 DIAGNOSIS — Z09 Encounter for follow-up examination after completed treatment for conditions other than malignant neoplasm: Secondary | ICD-10-CM

## 2013-02-13 DIAGNOSIS — D229 Melanocytic nevi, unspecified: Secondary | ICD-10-CM

## 2013-02-13 NOTE — Progress Notes (Signed)
   Subjective:    Patient ID: Kayla [name], female    DOB: 1965/08/27, 47 y.o.   MRN: [id number]  HPI patient presents this time a 15 day status post excision multiple lesions for biopsy plantar aspect right foot pathology confirmed melanocytic nevus benign lesions were noted   Review of Systems deferred     Objective:   Physical Exam Neurovascular status is intact incision is well coapted and healed no dehiscence noted. Sutures removed at this time. Patient may resume normal bathing and hygiene starting tomorrow at this time Neosporin and Band-Aid applied to the plantar lesion sub-for patient will be recheck in 2 months for possible long-term followup       Assessment & Plan:  Assessment good postop progress no dehiscence no signs of infection maintain Neosporin and Band-Aid on for couple of days may discontinue surgical shoe may resume normal bathing and hygiene normal activities followup in 2 months for possible long-term recheck postop.  Alvan Dame DPM

## 2013-02-13 NOTE — Patient Instructions (Signed)

## 2013-03-05 ENCOUNTER — Telehealth: Payer: Self-pay | Admitting: *Deleted

## 2013-03-05 NOTE — Telephone Encounter (Signed)
Pt states had surgery with Dr Ralene Cork nn/17/2014 and continues to have burning pain and hardness in the biopsy area.  What should she do?  I referred pt to scheduler.

## 2013-03-05 NOTE — Telephone Encounter (Signed)
SCHED APPT FOR 12-23

## 2013-03-06 ENCOUNTER — Ambulatory Visit (INDEPENDENT_AMBULATORY_CARE_PROVIDER_SITE_OTHER): Payer: Medicaid Other

## 2013-03-06 VITALS — BP 115/70 | HR 69 | Resp 20 | Ht 65.0 in | Wt 200.0 lb

## 2013-03-06 DIAGNOSIS — Z09 Encounter for follow-up examination after completed treatment for conditions other than malignant neoplasm: Secondary | ICD-10-CM

## 2013-03-06 DIAGNOSIS — D239 Other benign neoplasm of skin, unspecified: Secondary | ICD-10-CM

## 2013-03-06 DIAGNOSIS — Q828 Other specified congenital malformations of skin: Secondary | ICD-10-CM

## 2013-03-06 DIAGNOSIS — D229 Melanocytic nevi, unspecified: Secondary | ICD-10-CM

## 2013-03-06 NOTE — Progress Notes (Signed)
   Subjective:    Patient ID: Kayla [name], female    DOB: 1965-11-21, 47 y.o.   MRN: [id number]  "This foot is giving me the business.  It burns, I can only wear this shoe.  It is bringing tears to my eyes.  The pain medicine does not work." HPI    Review of Systems deferred at this time    Objective:   Physical Exam Neurovascular status is intact pedal pulses palpable. Patient at this time is proxy month a half status post excision of neoplastic lesions are verrucoid type lesions plantar aspect of her right foot. The largest lesion still extruded painful tender symptomatic there is some hypertrophic thickening and scar tissue noted this time the keratotic tissue is debrided there still some slight nucleation of the most proximal and largest lesion. At this time is packed with 60% salicylic acid under occlusion for 24 hours patient will initiate daily cleansing using cream or lotion every day and use of a pumice stone every other day help keep down the hyperkeratoses and scar tissue.       Assessment & Plan:  Assessment good postop progress or so some hyperkeratotic scar tissue present the plantar aspect for lesion had been excised. Will initiate topical salicylic acid and lotion daily and utilizing a pumice stone every other day. Reappointed one month for long-term followup no signs of infection noted cannot rule out possible recurrence of require a poor keratotic lesion or melanotic neoplasm however at this time. Be more consistent with a scar tissue and hypertrophy and scar reevaluate in 1 month as needed. Extremely sensitive and attempted debridement however once debrided had significant improvement with reduced pain with any pressure walking activities patient felt immediately improved.  Alvan Dame DPM

## 2013-03-06 NOTE — Patient Instructions (Signed)
Wart Surgery-Directions for Home Care  You will need: Dial antibacterial hand soap, sterile gauze,  Band-aids  1. Keep the original bandage on until the following morning.  Bathe or shower with the bandage on allowing it to soak, so that when removed it won't stick to the wound. 2. After showering or bathing, remove the old bandage and cleanse the area with Dial soap and water.  Place a few drops of Dial soap and water on a piece of guaze and gently scrub the area.  Dry with a clean piece of gauze. 3. Apply antibiotic cream (polysporin, triple antibiotic or similar) to the area and place a clean square gauze bandage over and cover with a band-aid. 4. In the evening, add a few drops of Dial soap to a basin of lukewarm water and soak your foot for 15 minutes.  After soaking, follow the instructions above for cleaning the area. 5. Continue cleansing the area as described above two times a day, applying sterile gauze dressings until the doctor informs you that it is not needed. 6. The charge for the surgical procedure includes the follow-up visits after surgery.  Additional treatments (if necessary) are not included. 7. The time required to heal the surgical site will depend upon the size and location of the wart.  Lesions under bony prominences heal slower.  The average healing time is 2 to 4 weeks. 8. Take over the counter Ibuprofen or Tylenol as needed should you experience any discomfort 9. If you do experience discomfort after surgery, keep the foot elevated and apply an ice pack over your ankle, 30 minutes on, 30 minutes off each hour for the rest of the day. If you have any questions , please do not hesitate to contact the office.ANTIBACTERIAL SOAP INSTRUCTIONS  THE DAY AFTER PROCEDURE  Please follow the instructions your doctor has marked.   Shower as usual. Before getting out, place a drop of antibacterial liquid soap (Dial) on a wet, clean washcloth.  Gently wipe washcloth over affected  area.  Afterward, rinse the area with warm water.  Blot the area dry with a soft cloth and cover with antibiotic ointment (neosporin, polysporin, bacitracin) and band aid or gauze and tape  Place 3-4 drops of antibacterial liquid soap in a quart of warm tap water.  Submerge foot into water for 20 minutes.  If bandage was applied after your procedure, leave on to allow for easy lift off, then remove and continue with soak for the remaining time.  Next, blot area dry with a soft cloth and cover with a bandage.  Apply other medications as directed by your doctor, such as cortisporin otic solution (eardrops) or neosporin antibiotic ointment 

## 2013-03-15 HISTORY — PX: SHOULDER SURGERY: SHX246

## 2013-03-20 NOTE — Progress Notes (Signed)
1) Biopsy-Excision skin lesions right foot and toe (3 lesions total)

## 2013-03-26 NOTE — Progress Notes (Signed)
1) Excision lesion right foot

## 2013-04-03 ENCOUNTER — Ambulatory Visit (INDEPENDENT_AMBULATORY_CARE_PROVIDER_SITE_OTHER): Payer: Medicaid Other

## 2013-04-03 DIAGNOSIS — Q828 Other specified congenital malformations of skin: Secondary | ICD-10-CM

## 2013-04-03 DIAGNOSIS — Z09 Encounter for follow-up examination after completed treatment for conditions other than malignant neoplasm: Secondary | ICD-10-CM

## 2013-04-03 DIAGNOSIS — B351 Tinea unguium: Secondary | ICD-10-CM

## 2013-04-03 MED ORDER — TAVABOROLE 5 % EX SOLN: CUTANEOUS | Status: DC

## 2013-04-03 MED ORDER — TAVABOROLE 5 % EX SOLN: 1.0000 [drp] | Freq: Every day | CUTANEOUS | Status: DC

## 2013-04-03 NOTE — Progress Notes (Signed)
   Subjective:    Patient ID: Kayla [name], female    DOB: 08/19/1965, 48 y.o.   MRN: [id number]  HPI Comments: "It still feels like its burning sometimes" (plantar right foot)  I just want him to check my big toes too. They feel ingrown"  Pt states that she has been feeling some tenderness along both borders of 1st toes bilateral. They have been bothersome for years but recently more achy. The toenails are thick and discolored as well. Tries to clip them down but still hurts.     Review of Systems  All other systems reviewed and are negative.       Objective:   Physical Exam Neurovascular status intact no new changes noted keratotic lesions plantar right foot have resolved there is no residual nucleated lesion identified. Some diffuse keratoses debrided at this time. The knee problems patient has is thickening discoloration and brittleness and friability of nails 1 through 5 bilateral. They're getting painful tender and symptomatic. This time patient does have intact neurovascular status no other Indicating factors and will initiate topical antifungal therapy utilizing kerydin topical antifungal. Apply daily for 12 months duration to this is for to a mail order pharmacy and will be provided to the patient through discomfort on.       Assessment & Plan:  Assessment this time is onychomycosis with treatment topical antifungal therapies once daily for 12 months duration as instructed. As far as the postoperative site stairwell resolved and heal discharge to an as-needed basis for any future followup. Next  Harriet Masson DPM

## 2013-04-03 NOTE — Patient Instructions (Signed)
Onychomycosis/Fungal Toenails  WHAT IS IT? An infection that lies within the keratin of your nail plate that is caused by a fungus.  WHY ME? Fungal infections affect all ages, sexes, races, and creeds.  There may be many factors that predispose you to a fungal infection such as age, coexisting medical conditions such as diabetes, or an autoimmune disease; stress, medications, fatigue, genetics, etc.  Bottom line: fungus thrives in a warm, moist environment and your shoes offer such a location.  IS IT CONTAGIOUS? Theoretically, yes.  You do not want to share shoes, nail clippers or files with someone who has fungal toenails.  Walking around barefoot in the same room or sleeping in the same bed is unlikely to transfer the organism.  It is important to realize, however, that fungus can spread easily from one nail to the next on the same foot.  HOW DO WE TREAT THIS?  There are several ways to treat this condition.  Treatment may depend on many factors such as age, medications, pregnancy, liver and kidney conditions, etc.  It is best to ask your doctor which options are available to you.  1. No treatment.   Unlike many other medical concerns, you can live with this condition.  However for many people this can be a painful condition and may lead to ingrown toenails or a bacterial infection.  It is recommended that you keep the nails cut short to help reduce the amount of fungal nail. 2. Topical treatment.  These range from herbal remedies to prescription strength nail lacquers.  About 40-50% effective, topicals require twice daily application for approximately 9 to 12 months or until an entirely new nail has grown out.  The most effective topicals are medical grade medications available through physicians offices. 3. Oral antifungal medications.  With an 80-90% cure rate, the most common oral medication requires 3 to 4 months of therapy and stays in your system for a year as the new nail grows out.  Oral  antifungal medications do require blood work to make sure it is a safe drug for you.  A liver function panel will be performed prior to starting the medication and after the first month of treatment.  It is important to have the blood work performed to avoid any harmful side effects.  In general, this medication safe but blood work is required. 4. Laser Therapy.  This treatment is performed by applying a specialized laser to the affected nail plate.  This therapy is noninvasive, fast, and non-painful.  It is not covered by insurance and is therefore, out of pocket.  The results have been very good with a 80-95% cure rate.  [facility] is the only practice in the area to offer this therapy. 5. Permanent Nail Avulsion.  Removing the entire nail so that a new nail will not grow back.  Apply topical antifungal kerydin to affected nails daily for 12 months duration. Follow instructions should improvement we'll take a year

## 2013-07-10 ENCOUNTER — Other Ambulatory Visit: Payer: Self-pay

## 2013-07-10 DIAGNOSIS — Z1231 Encounter for screening mammogram for malignant neoplasm of breast: Secondary | ICD-10-CM

## 2013-07-23 ENCOUNTER — Ambulatory Visit
Admission: RE | Admit: 2013-07-23 | Discharge: 2013-07-23 | Disposition: A | Discharge status: Home / Self Care | Payer: Medicaid Other | Source: Ambulatory Visit

## 2013-07-23 ENCOUNTER — Encounter (INDEPENDENT_AMBULATORY_CARE_PROVIDER_SITE_OTHER): Payer: Self-pay

## 2013-07-23 DIAGNOSIS — Z1231 Encounter for screening mammogram for malignant neoplasm of breast: Secondary | ICD-10-CM

## 2013-10-07 ENCOUNTER — Encounter (HOSPITAL_COMMUNITY): Payer: Self-pay | Admitting: Emergency Medicine

## 2013-10-07 ENCOUNTER — Emergency Department (HOSPITAL_COMMUNITY)
Admission: EM | Admit: 2013-10-07 | Discharge: 2013-10-07 | Disposition: A | Discharge status: Home / Self Care | Payer: Medicaid Other | Attending: Dermatology | Admitting: Dermatology

## 2013-10-07 DIAGNOSIS — Z79899 Other long term (current) drug therapy: Secondary | ICD-10-CM | POA: Diagnosis not present

## 2013-10-07 DIAGNOSIS — Z87891 Personal history of nicotine dependence: Secondary | ICD-10-CM | POA: Diagnosis not present

## 2013-10-07 DIAGNOSIS — Y939 Activity, unspecified: Secondary | ICD-10-CM | POA: Insufficient documentation

## 2013-10-07 DIAGNOSIS — Z791 Long term (current) use of non-steroidal anti-inflammatories (NSAID): Secondary | ICD-10-CM | POA: Insufficient documentation

## 2013-10-07 DIAGNOSIS — M129 Arthropathy, unspecified: Secondary | ICD-10-CM | POA: Insufficient documentation

## 2013-10-07 DIAGNOSIS — IMO0001 Reserved for inherently not codable concepts without codable children: Secondary | ICD-10-CM | POA: Diagnosis present

## 2013-10-07 DIAGNOSIS — Y9289 Other specified places as the place of occurrence of the external cause: Secondary | ICD-10-CM | POA: Insufficient documentation

## 2013-10-07 DIAGNOSIS — W57XXXA Bitten or stung by nonvenomous insect and other nonvenomous arthropods, initial encounter: Secondary | ICD-10-CM

## 2013-10-07 DIAGNOSIS — G8929 Other chronic pain: Secondary | ICD-10-CM | POA: Insufficient documentation

## 2013-10-07 DIAGNOSIS — G43909 Migraine, unspecified, not intractable, without status migrainosus: Secondary | ICD-10-CM | POA: Insufficient documentation

## 2013-10-07 MED ORDER — HYDROXYZINE HCL 25 MG PO TABS
25.0000 mg | ORAL_TABLET | Freq: Once | ORAL | Status: AC
  Administered 2013-10-07: 25 mg via ORAL
  Filled 2013-10-07: qty 1

## 2013-10-07 MED ORDER — HYDROXYZINE HCL 25 MG PO TABS: 25.0000 mg | ORAL_TABLET | Freq: Four times a day (QID) | ORAL | Status: DC | PRN

## 2013-10-07 NOTE — ED Notes (Signed)
Pt presents with ?insect bites to bilat upper arms only. Pt states she first noticed bites Tuesday, pt visited out of town friends Saturday.

## 2013-10-07 NOTE — ED Provider Notes (Signed)
CSN: [id number]     Arrival date & time 10/07/13  0006 History   First MD Initiated Contact with Patient 10/07/13 0048     Chief Complaint  Patient presents with  . Insect bites      (Consider location/radiation/quality/duration/timing/severity/associated sxs/prior Treatment) HPI Comments: This is a patient who visited friends.  Several days ago.  They do have a number of large dogs.  She slept on the couch.  She noticed today after eating, that she had a number of bites to her upper arms.  She has not taken any medication for itching.  Denies any shortness of breath, cough  The history is provided by the patient.    Past Medical History  Diagnosis Date  . Chronic shoulder pain     left  . Arthritis   . Migraine   . Knee pain   . Chronic neck pain   . Chronic back pain   . Sciatica    Past Surgical History  Procedure Laterality Date  . Cesarean section    . Tubal ligation    . Lesion excision Right     X3   Family History  Problem Relation Age of Onset  . Cancer Mother   . Cancer Father    History  Substance Use Topics  . Smoking status: Former Research scientist (life sciences)  . Smokeless tobacco: Never Used  . Alcohol Use: Yes     Comment: occasionally   OB History   Grav Para Term Preterm Abortions TAB SAB Ect Mult Living                 Review of Systems  Respiratory: Negative for cough and shortness of breath.   Skin: Positive for wound.      Allergies  Review of patient's allergies indicates no active allergies.  Home Medications   Prior to Admission medications   Medication Sig Start Date End Date Taking? Authorizing Provider  aspirin-acetaminophen-caffeine (EXCEDRIN EXTRA STRENGTH) [phone] MG per tablet Take 1 tablet by mouth every 6 (six) hours as needed for headache.   Yes Historical Provider, MD  cetirizine (ZYRTEC) 10 MG tablet Take 10 mg by mouth daily as needed for allergies.    Yes Historical Provider, MD  cholecalciferol (VITAMIN D) 1000 UNITS tablet Take  1,000 Units by mouth daily.   Yes Historical Provider, MD  DULoxetine (CYMBALTA) 30 MG capsule Take 60 mg by mouth daily.    Yes Historical Provider, MD  HYDROcodone-acetaminophen (NORCO/VICODIN) 5-325 MG per tablet 1 or 2 tabs PO q6 hours prn pain 09/17/12  Yes Alfonzo Feller, DO  Linaclotide (LINZESS) 290 MCG CAPS capsule Take 290 mcg by mouth daily.   Yes Historical Provider, MD  Olopatadine HCl (PATADAY) 0.2 % SOLN Place 1 drop into both eyes daily.   Yes Historical Provider, MD  hydrOXYzine (ATARAX/VISTARIL) 25 MG tablet Take 1 tablet (25 mg total) by mouth every 6 (six) hours as needed. 10/07/13   Garald Balding, NP  methocarbamol (ROBAXIN) 500 MG tablet Take 2 tablets (1,000 mg total) by mouth 4 (four) times daily as needed (muscle spasm/pain). 09/17/12   Alfonzo Feller, DO  rizatriptan (MAXALT-MLT) 10 MG disintegrating tablet Take 10 mg by mouth as needed for migraine. May repeat in 2 hours if needed    Historical Provider, MD   BP 128/85  Pulse 96  Temp(Src) 98.1 F (36.7 C) (Oral)  Resp 18  Wt 229 lb 6.4 oz (104.055 kg)  SpO2 97%  LMP 10/06/2013 Physical Exam  Nursing note and vitals reviewed. Constitutional: She appears well-nourished.  HENT:  Head: Normocephalic.  Eyes: Pupils are equal, round, and reactive to light.  Neck: Normal range of motion.  Cardiovascular: Normal rate and regular rhythm.   Pulmonary/Chest: Effort normal.  Skin:  A number of bites, in a linear pattern, increase of 3 on the upper extremities, consistent with flea bites    ED Course  Procedures (including critical care time) Labs Review Labs Reviewed - No data to display  Imaging Review No results found.   EKG Interpretation None      MDM   Final diagnoses:  Flea bite         Garald Balding, NP 10/07/13 2043  Garald Balding, NP 10/07/13 2043

## 2013-10-08 NOTE — ED Provider Notes (Signed)
Medical screening examination/treatment/procedure(s) were performed by non-physician practitioner and as supervising physician I was immediately available for consultation/collaboration.     Mirna Mires, MD 10/08/13 [phone]

## 2014-08-22 ENCOUNTER — Other Ambulatory Visit: Payer: Self-pay

## 2014-08-22 DIAGNOSIS — Z1231 Encounter for screening mammogram for malignant neoplasm of breast: Secondary | ICD-10-CM

## 2014-08-23 ENCOUNTER — Inpatient Hospital Stay: Admission: RE | Admit: 2014-08-23 | Payer: Medicaid Other | Source: Ambulatory Visit

## 2014-11-06 ENCOUNTER — Ambulatory Visit
Admission: RE | Admit: 2014-11-06 | Discharge: 2014-11-06 | Disposition: A | Discharge status: Home / Self Care | Payer: 59 | Source: Ambulatory Visit

## 2014-11-06 DIAGNOSIS — Z1231 Encounter for screening mammogram for malignant neoplasm of breast: Secondary | ICD-10-CM

## 2015-06-09 ENCOUNTER — Encounter (HOSPITAL_COMMUNITY): Payer: Self-pay | Admitting: Emergency Medicine

## 2015-06-09 ENCOUNTER — Emergency Department (HOSPITAL_COMMUNITY)
Admission: EM | Admit: 2015-06-09 | Discharge: 2015-06-09 | Disposition: A | Discharge status: Home / Self Care | Payer: 59 | Attending: Emergency Medicine | Admitting: Emergency Medicine

## 2015-06-09 ENCOUNTER — Emergency Department (HOSPITAL_COMMUNITY): Payer: 59

## 2015-06-09 DIAGNOSIS — M5430 Sciatica, unspecified side: Secondary | ICD-10-CM | POA: Diagnosis not present

## 2015-06-09 DIAGNOSIS — G8929 Other chronic pain: Secondary | ICD-10-CM | POA: Diagnosis not present

## 2015-06-09 DIAGNOSIS — J069 Acute upper respiratory infection, unspecified: Secondary | ICD-10-CM | POA: Diagnosis not present

## 2015-06-09 DIAGNOSIS — R0789 Other chest pain: Secondary | ICD-10-CM | POA: Diagnosis not present

## 2015-06-09 DIAGNOSIS — Z79899 Other long term (current) drug therapy: Secondary | ICD-10-CM | POA: Diagnosis not present

## 2015-06-09 DIAGNOSIS — Z87891 Personal history of nicotine dependence: Secondary | ICD-10-CM | POA: Insufficient documentation

## 2015-06-09 DIAGNOSIS — M1990 Unspecified osteoarthritis, unspecified site: Secondary | ICD-10-CM | POA: Diagnosis not present

## 2015-06-09 DIAGNOSIS — Z8679 Personal history of other diseases of the circulatory system: Secondary | ICD-10-CM | POA: Diagnosis not present

## 2015-06-09 DIAGNOSIS — R05 Cough: Secondary | ICD-10-CM | POA: Diagnosis present

## 2015-06-09 LAB — BASIC METABOLIC PANEL
Anion gap: 10 (ref 5–15)
BUN: 12 mg/dL (ref 6–20)
CHLORIDE: 102 mmol/L (ref 101–111)
CO2: 25 mmol/L (ref 22–32)
CREATININE: 0.67 mg/dL (ref 0.44–1.00)
Calcium: 9 mg/dL (ref 8.9–10.3)
GFR calc Af Amer: >60 mL/min (ref >60)
GLUCOSE: 77 mg/dL (ref 65–99)
Potassium: 3.9 mmol/L (ref 3.5–5.1)
SODIUM: 137 mmol/L (ref 135–145)

## 2015-06-09 LAB — CBC
HEMATOCRIT: 41.7 % (ref 36.0–46.0)
Hemoglobin: 14.1 g/dL (ref 12.0–15.0)
MCH: 26.6 pg (ref 26.0–34.0)
MCHC: 33.8 g/dL (ref 30.0–36.0)
MCV: 78.7 fL (ref 78.0–100.0)
PLATELETS: 212 10*3/uL (ref 150–400)
RBC: 5.3 MIL/uL — ABNORMAL HIGH (ref 3.87–5.11)
RDW: 14.3 % (ref 11.5–15.5)
WBC: 2.7 10*3/uL — AB (ref 4.0–10.5)

## 2015-06-09 LAB — I-STAT TROPONIN, ED: Troponin i, poc: 0 ng/mL (ref 0.00–0.08)

## 2015-06-09 MED ORDER — ALBUTEROL SULFATE HFA 108 (90 BASE) MCG/ACT IN AERS
2.0000 | INHALATION_SPRAY | RESPIRATORY_TRACT | Status: DC | PRN
  Administered 2015-06-09: 2 via RESPIRATORY_TRACT
  Filled 2015-06-09: qty 6.7

## 2015-06-09 MED ORDER — HYDROCODONE-HOMATROPINE 5-1.5 MG/5ML PO SYRP: 5.0000 mL | ORAL_SOLUTION | Freq: Four times a day (QID) | ORAL | Status: DC | PRN

## 2015-06-09 NOTE — ED Notes (Signed)
Pt c/o malaise, cough with white mucus, nausea, sore throat, posterior body aches, headaches onset Saturday. Stabbing chest pain worse with movement and deep inspiration radiating into back onset this morning.

## 2015-06-09 NOTE — Discharge Instructions (Signed)
Chest Wall Pain °Chest wall pain is pain in or around the bones and muscles of your chest. Sometimes, an injury causes this pain. Sometimes, the cause may not be known. This pain may take several weeks or longer to get better. °HOME CARE °Pay attention to any changes in your symptoms. Take these actions to help with your pain: °· Rest as told by your doctor. °· Avoid activities that cause pain. Try not to use your chest, belly (abdominal), or side muscles to lift heavy things. °· If directed, apply ice to the painful area: °¨ Put ice in a plastic bag. °¨ Place a towel between your skin and the bag. °¨ Leave the ice on for 20 minutes, 2-3 times per day. °· Take over-the-counter and prescription medicines only as told by your doctor. °· Do not use tobacco products, including cigarettes, chewing tobacco, and e-cigarettes. If you need help quitting, ask your doctor. °· Keep all follow-up visits as told by your doctor. This is important. °GET HELP IF: °· You have a fever. °· Your chest pain gets worse. °· You have new symptoms. °GET HELP RIGHT AWAY IF: °· You feel sick to your stomach (nauseous) or you throw up (vomit). °· You feel sweaty or light-headed. °· You have a cough with phlegm (sputum) or you cough up blood. °· You are short of breath. °  °This information is not intended to replace advice given to you by your health care provider. Make sure you discuss any questions you have with your health care provider. °  °Document Released: 08/18/2007 Document Revised: 11/20/2014 Document Reviewed: 05/27/2014 °Elsevier Interactive Patient Education ©2016 [employer] ° °

## 2015-06-09 NOTE — ED Provider Notes (Signed)
CSN: [id number]     Arrival date & time 06/09/15  1605 History   First MD Initiated Contact with Patient 06/09/15 1935     Chief Complaint  Patient presents with  . Chest Pain  . Cough     (Consider location/radiation/quality/duration/timing/severity/associated sxs/prior Treatment) Patient is a 50 y.o. female presenting with chest pain and cough. The history is provided by the patient.  Chest Pain Pain location:  Substernal area Pain quality: aching and sharp   Pain radiates to:  Upper back Pain radiates to the back: yes   Pain severity:  Moderate Onset quality:  Gradual Duration:  1 day Timing:  Constant Progression:  Unchanged Chronicity:  New Context comment:  Patient has had URI symptoms for the last 2 days of the chest pain started today Relieved by:  None tried Worsened by:  Movement, deep breathing and coughing Ineffective treatments:  None tried Associated symptoms: cough and fever   Associated symptoms: no abdominal pain, no nausea, no shortness of breath, not vomiting and no weakness   Associated symptoms comment:  Myalgias Risk factors comment:  Stopped smoking last week Cough Associated symptoms: chest pain and fever   Associated symptoms: no shortness of breath     Past Medical History  Diagnosis Date  . Chronic shoulder pain     left  . Arthritis   . Migraine   . Knee pain   . Chronic neck pain   . Chronic back pain   . Sciatica    Past Surgical History  Procedure Laterality Date  . Cesarean section    . Tubal ligation    . Lesion excision Right     X3   Family History  Problem Relation Age of Onset  . Cancer Mother   . Cancer Father    Social History  Substance Use Topics  . Smoking status: Former Research scientist (life sciences)  . Smokeless tobacco: Never Used  . Alcohol Use: Yes     Comment: occasionally   OB History    No data available     Review of Systems  Constitutional: Positive for fever.  Respiratory: Positive for cough. Negative for shortness  of breath.   Cardiovascular: Positive for chest pain.  Gastrointestinal: Negative for nausea, vomiting and abdominal pain.  Neurological: Negative for weakness.  All other systems reviewed and are negative.     Allergies  Codeine  Home Medications   Prior to Admission medications   Medication Sig Start Date End Date Taking? Authorizing Provider  ibuprofen (ADVIL,MOTRIN) 200 MG tablet Take 400 mg by mouth every 6 (six) hours as needed for headache, mild pain or moderate pain.   Yes Historical Provider, MD  Linaclotide (LINZESS) 290 MCG CAPS capsule Take 290 mcg by mouth daily.   Yes Historical Provider, MD   BP 127/88 mmHg  Pulse 72  Temp(Src) 98.3 F (36.8 C) (Oral)  Resp 19  SpO2 100%  LMP 06/09/2015 Physical Exam  Constitutional: She is oriented to person, place, and time. She appears well-developed and well-nourished. No distress.  HENT:  Head: Normocephalic and atraumatic.  Right Ear: Tympanic membrane normal.  Left Ear: Tympanic membrane normal.  Nose: Nose normal.  Mouth/Throat: Posterior oropharyngeal erythema present. No oropharyngeal exudate or posterior oropharyngeal edema.  Eyes: Conjunctivae and EOM are normal. Pupils are equal, round, and reactive to light.  Neck: Normal range of motion. Neck supple.  Cardiovascular: Normal rate, regular rhythm and intact distal pulses.   No murmur heard. Pulmonary/Chest: Effort normal and breath sounds  normal. No respiratory distress. She has no wheezes. She has no rales. She exhibits tenderness.  Decreased expiratory lung sounds  Abdominal: Soft. She exhibits no distension. There is no tenderness. There is no rebound and no guarding.  Musculoskeletal: Normal range of motion. She exhibits no edema or tenderness.  Lymphadenopathy:    She has no cervical adenopathy.  Neurological: She is alert and oriented to person, place, and time.  Skin: Skin is warm and dry. No rash noted. No erythema.  Psychiatric: She has a normal mood  and affect. Her behavior is normal.  Nursing note and vitals reviewed.   ED Course  Procedures (including critical care time) Labs Review Labs Reviewed  CBC - Abnormal; Notable for the following:    WBC 2.7 (*)    RBC 5.30 (*)    All other components within normal limits  BASIC METABOLIC PANEL  I-STAT TROPOININ, ED    Imaging Review Dg Chest 2 View  06/09/2015  CLINICAL DATA:  Cough for 3 days, mid anterior chest pain EXAM: CHEST  2 VIEW COMPARISON:  08/25/2005 FINDINGS: The heart size and mediastinal contours are within normal limits. Both lungs are clear. The visualized skeletal structures are unremarkable. IMPRESSION: No active cardiopulmonary disease. Electronically Signed   By: Kathreen Devoid   On: 06/09/2015 17:03   I have personally reviewed and evaluated these images and lab results as part of my medical decision-making.   EKG Interpretation   Date/Time:  Monday June 09 2015 16:43:32 EDT Ventricular Rate:  86 PR Interval:  165 QRS Duration: 83 QT Interval:  362 QTC Calculation: 433 R Axis:   58 Text Interpretation:  Sinus rhythm Low voltage, precordial leads Normal  ECG No significant change since last tracing Confirmed by Maryan Rued  MD,  Loree Fee ([id number]) on 06/09/2015 7:39:25 PM      MDM   Final diagnoses:  URI (upper respiratory infection)  Chest wall pain    Pt with symptoms consistent with viral URI.  Well appearing here.  No signs of breathing difficulty  No signs of pharyngitis, otitis or abnormal abdominal findings.  Patient is complaining of chest pain that's pleuritic and with movement which seems to be more muscular which is most related to the cough. She does have decreased expiratory lung sounds that she was given an inhaler.  EKG within normal limits.  Blood work done in triage also within normal limits. CXR wnl and pt to return with any further problems.     Blanchie Dessert, MD 06/09/15 2116

## 2015-06-09 NOTE — ED Notes (Signed)
Patient c/o generalized upper body aches, congestion, cough, sore throat, and nausea.

## 2015-06-09 NOTE — ED Notes (Signed)
Attempted blood draw with no success.     

## 2015-10-20 ENCOUNTER — Ambulatory Visit (INDEPENDENT_AMBULATORY_CARE_PROVIDER_SITE_OTHER): Payer: 59

## 2015-10-20 ENCOUNTER — Encounter: Payer: Self-pay | Admitting: Podiatry

## 2015-10-20 ENCOUNTER — Ambulatory Visit (INDEPENDENT_AMBULATORY_CARE_PROVIDER_SITE_OTHER): Payer: 59 | Admitting: Podiatry

## 2015-10-20 VITALS — BP 120/91 | HR 76 | Resp 16

## 2015-10-20 DIAGNOSIS — M21619 Bunion of unspecified foot: Secondary | ICD-10-CM

## 2015-10-20 DIAGNOSIS — L600 Ingrowing nail: Secondary | ICD-10-CM | POA: Diagnosis not present

## 2015-10-20 DIAGNOSIS — M779 Enthesopathy, unspecified: Secondary | ICD-10-CM

## 2015-10-20 DIAGNOSIS — M79671 Pain in right foot: Secondary | ICD-10-CM

## 2015-10-20 DIAGNOSIS — M79672 Pain in left foot: Secondary | ICD-10-CM | POA: Diagnosis not present

## 2015-10-20 MED ORDER — TRIAMCINOLONE ACETONIDE 10 MG/ML IJ SUSP
10.0000 mg | Freq: Once | INTRAMUSCULAR | Status: AC
  Administered 2015-10-20: 10 mg

## 2015-10-20 NOTE — Patient Instructions (Signed)

## 2015-11-04 ENCOUNTER — Telehealth: Payer: Self-pay | Admitting: *Deleted

## 2015-11-04 NOTE — Telephone Encounter (Signed)
Tried to call patient at [phone] (Home #) to check to see how they were doing from their ingrown toenail procedure that was performed on Monday, October 20, 2015. The phone just kept ringing and I was not able to leave a message.

## 2015-12-12 NOTE — Progress Notes (Signed)
Subjective:     Patient ID: Kayla [name], female   DOB: [birth year], 50 y.o.   MRN: [id number]  HPI patient presents with inflammation fasciitis and also ingrown toenails that are sore. States she's tried to trim them and soak them without relief   Review of Systems  All other systems reviewed and are negative.      Objective:   Physical Exam Neurovascular status intact with incurvated nailbed that's very tender when pressed with difficulty wearing shoe gear and has tried to trim it soak it without relief    Assessment:     Ingrown toenail deformity with incurvation and pain    Plan:     H&P condition reviewed and recommended removal of corner. Explained procedure and risk and at this time I infiltrated the hallux 60 Milligan times like Marcaine mixture removed corner exposed matrix and applied phenol 3 applications 30 seconds followed by alcohol lavage and sterile dressing. Gave instructions on soaks and also continued treatment for capsulitis fasciitis condition

## 2016-01-22 ENCOUNTER — Encounter: Payer: Self-pay | Admitting: Podiatry

## 2016-01-22 ENCOUNTER — Ambulatory Visit (INDEPENDENT_AMBULATORY_CARE_PROVIDER_SITE_OTHER): Payer: 59 | Admitting: Podiatry

## 2016-01-22 DIAGNOSIS — M779 Enthesopathy, unspecified: Secondary | ICD-10-CM

## 2016-01-22 DIAGNOSIS — M21621 Bunionette of right foot: Secondary | ICD-10-CM

## 2016-01-22 DIAGNOSIS — M2041 Other hammer toe(s) (acquired), right foot: Secondary | ICD-10-CM

## 2016-01-22 MED ORDER — TRIAMCINOLONE ACETONIDE 10 MG/ML IJ SUSP
10.0000 mg | Freq: Once | INTRAMUSCULAR | Status: AC
  Administered 2016-01-22: 10 mg

## 2016-01-22 NOTE — Progress Notes (Signed)
Subjective:     Patient ID: Kayla [name], female   DOB: 1965/12/17, 50 y.o.   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 I carefully injected around the fifth MPJ 3 mg Dexon some Kenalog 5 mg Xylocaine advised on wider shoes and reappoint in 3 weeks to reevaluate

## 2016-02-12 ENCOUNTER — Ambulatory Visit: Payer: 59 | Admitting: Podiatry

## 2016-02-19 ENCOUNTER — Ambulatory Visit: Payer: 59 | Admitting: Podiatry

## 2016-03-15 HISTORY — PX: FOOT SURGERY: SHX648

## 2016-03-26 DIAGNOSIS — Z87891 Personal history of nicotine dependence: Secondary | ICD-10-CM | POA: Diagnosis not present

## 2016-03-26 DIAGNOSIS — J069 Acute upper respiratory infection, unspecified: Secondary | ICD-10-CM | POA: Diagnosis not present

## 2016-04-22 DIAGNOSIS — R030 Elevated blood-pressure reading, without diagnosis of hypertension: Secondary | ICD-10-CM | POA: Diagnosis not present

## 2016-04-22 DIAGNOSIS — K5909 Other constipation: Secondary | ICD-10-CM | POA: Diagnosis not present

## 2016-04-22 DIAGNOSIS — M5431 Sciatica, right side: Secondary | ICD-10-CM | POA: Diagnosis not present

## 2016-05-10 DIAGNOSIS — R110 Nausea: Secondary | ICD-10-CM | POA: Diagnosis not present

## 2016-05-10 DIAGNOSIS — K219 Gastro-esophageal reflux disease without esophagitis: Secondary | ICD-10-CM | POA: Diagnosis not present

## 2016-05-10 DIAGNOSIS — K3184 Gastroparesis: Secondary | ICD-10-CM | POA: Diagnosis not present

## 2016-06-14 ENCOUNTER — Encounter: Payer: Self-pay | Admitting: Podiatry

## 2016-06-14 ENCOUNTER — Ambulatory Visit (INDEPENDENT_AMBULATORY_CARE_PROVIDER_SITE_OTHER): Payer: 59

## 2016-06-14 ENCOUNTER — Ambulatory Visit (INDEPENDENT_AMBULATORY_CARE_PROVIDER_SITE_OTHER): Payer: 59 | Admitting: Podiatry

## 2016-06-14 DIAGNOSIS — M21619 Bunion of unspecified foot: Secondary | ICD-10-CM

## 2016-06-14 DIAGNOSIS — M2022 Hallux rigidus, left foot: Secondary | ICD-10-CM | POA: Diagnosis not present

## 2016-06-14 DIAGNOSIS — M2010 Hallux valgus (acquired), unspecified foot: Secondary | ICD-10-CM | POA: Diagnosis not present

## 2016-06-14 NOTE — Patient Instructions (Addendum)
Bunionectomy A bunionectomy is a surgical procedure to remove a bunion. A bunion is a visible bump of bone on the inside of your foot where your big toe meets the rest of your foot. A bunion can develop when pressure turns this bone (first metatarsal) toward the other toes. Shoes that are too tight are the most common cause of bunions. Bunions can also be caused by diseases, such as arthritis and polio. You may need a bunionectomy if your bunion is very large and painful or it affects your ability to walk. Tell a health care provider about:  Any allergies you have.  All medicines you are taking, including vitamins, herbs, eye drops, creams, and over-the-counter medicines.  Any problems you or family members have had with anesthetic medicines.  Any blood disorders you have.  Any surgeries you have had.  Any medical conditions you have. What are the risks? Generally, this is a safe procedure. However, problems may occur, including:  Infection.  Pain.  Nerve damage.  Bleeding or blood clots.  Reactions to medicines.  Numbness, stiffness, or arthritis in your toe.  Foot problems that continue even after the procedure. What happens before the procedure?  Ask your health care provider about:  Changing or stopping your regular medicines. This is especially important if you are taking diabetes medicines or blood thinners.  Taking medicines such as aspirin and ibuprofen. These medicines can thin your blood. Do not take these medicines before your procedure if your health care provider instructs you not to.  Do not drink alcohol before the procedure as directed by your health care provider.  Do not use tobacco products, including cigarettes, chewing tobacco, or electronic cigarettes, before the procedure as directed by your health care provider. If you need help quitting, ask your health care provider.  Ask your health care provider what kind of medicine you will be given during  your procedure. A bunionectomy may be done using one of these:  A medicine that numbs the area (local anesthetic).  A medicine that makes you go to sleep (general anesthetic). If you will be given general anesthetic, do not eat or drink anything after midnight on the night before the procedure or as directed by your health care provider. What happens during the procedure?  An IV tube may be inserted into a vein.  You will be given local anesthetic or general anesthetic.  The surgeon will make a cut (incision) over the enlarged area at the first joint of the big toe. The surgeon will remove the bunion.  You may have more than one incision if any of the bones in your big toe need to be moved. A bone itself may need to be cut.  Sometimes the tissues around the big toe may also need to be cut then tightened or loosened to reposition the toe.  Screws or other hardware may be used to keep your foot in thecorrect position.  The incision will be closed with stitches (sutures) and covered with adhesive strips or another type of bandage (dressing). What happens after the procedure?  You may spend some time in a recovery area.  Your blood pressure, heart rate, breathing rate, and blood oxygen level will be monitored often until the medicines you were given have worn off. This information is not intended to replace advice given to you by your health care provider. Make sure you discuss any questions you have with your health care provider. Document Released: 02/12/2005 Document Revised: 08/07/2015 Document Reviewed: 10/17/2013   Elsevier Interactive Patient Education  2017 [place] Instructions  Congratulations, you have decided to take an important step to improving your quality of life.  You can be assured that the doctors of [facility] will be with you every step of the way.  1. Plan to be at the surgery center/hospital at least 1 (one) hour prior to your scheduled  time unless otherwise directed by the surgical center/hospital staff.  You must have a responsible adult accompany you, remain during the surgery and drive you home.  Make sure you have directions to the surgical center/hospital and know how to get there on time. 2. For hospital based surgery you will need to obtain a history and physical form from your family physician within 1 month prior to the date of surgery- we will give you a form for you primary physician.  3. We make every effort to accommodate the date you request for surgery.  There are however, times where surgery dates or times have to be moved.  We will contact you as soon as possible if a change in schedule is required.   4. No Aspirin/Ibuprofen for one week before surgery.  If you are on aspirin, any non-steroidal anti-inflammatory medications (Mobic, Aleve, Ibuprofen) you should stop taking it 7 days prior to your surgery.  You make take Tylenol  For pain prior to surgery.  5. Medications- If you are taking daily heart and blood pressure medications, seizure, reflux, allergy, asthma, anxiety, pain or diabetes medications, make sure the surgery center/hospital is aware before the day of surgery so they may notify you which medications to take or avoid the day of surgery. 6. No food or drink after midnight the night before surgery unless directed otherwise by surgical center/hospital staff. 7. No alcoholic beverages 24 hours prior to surgery.  No smoking 24 hours prior to or 24 hours after surgery. 8. Wear loose pants or shorts- loose enough to fit over bandages, boots, and casts. 9. No slip on shoes, sneakers are best. 10. Bring your boot with you to the surgery center/hospital.  Also bring crutches or a walker if your physician has prescribed it for you.  If you do not have this equipment, it will be provided for you after surgery. 11. If you have not been contracted by the surgery center/hospital by the day before your surgery, call to  confirm the date and time of your surgery. 12. Leave-time from work may vary depending on the type of surgery you have.  Appropriate arrangements should be made prior to surgery with your employer. 13. Prescriptions will be provided immediately following surgery by your doctor.  Have these filled as soon as possible after surgery and take the medication as directed. 14. Remove nail polish on the operative foot. 15. Wash the night before surgery.  The night before surgery wash the foot and leg well with the antibacterial soap provided and water paying special attention to beneath the toenails and in between the toes.  Rinse thoroughly with water and dry well with a towel.  Perform this wash unless told not to do so by your physician.  Enclosed: 1 Ice pack (please put in freezer the night before surgery)   1 Hibiclens skin cleaner   Pre-op Instructions  If you have any questions regarding the instructions, do not hesitate to call our office.  [place]: [place], [address] [address] [place]: [address], [place],  [address] [phone]  [place]: [address]  [place], [place] [address] [phone]   Dr. Ila Mcgill DPM, Dr. Celesta Gentile DPM, Dr. Lanelle Bal DPM, Dr. Landis Martins DPM

## 2016-06-15 ENCOUNTER — Telehealth: Payer: Self-pay | Admitting: *Deleted

## 2016-06-15 NOTE — Progress Notes (Signed)
Subjective:     Patient ID: Kayla [name], female   DOB: 1965-09-11, 51 y.o.   MRN: [id number]  HPI patient presents stating this bunion is really bother me more and more and I cannot wear shoe gear comfortably. 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 I have recommended a biplanar-type osteotomy to lower the first metatarsal and allow for better motion with the possibility that there could be arthritis which I will not be able to correct. I did allow her to read consent form going over alternative treatments complications and she is welcome to go over this and she was encouraged to ask questions. After reviewing question she signed consent form understanding recovery take approximate 6 months and I went ahead today and I dispensed air fracture walker with all instructions on usage. Patient is scheduled for outpatient surgery  X-ray indicated elevation of the intermetatarsal angle and a dorsal medial eminence with moderate spurring associated with an elevated first metatarsal

## 2016-06-15 NOTE — Telephone Encounter (Signed)
"  I had an appointment with Dr. Paulla Dolly yesterday.  He suggested surgery.  His nurse, Juliann Pulse and I discussed a date of April 24.  She said I need to speak to you to lock it in.  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  You can leave me a voice message and I'll call you back tomorrow."

## 2016-06-16 NOTE — Telephone Encounter (Signed)
I left her a message that we have her scheduled for 07/06/2016.  Call if you have any further questions.

## 2016-06-21 ENCOUNTER — Telehealth: Payer: Self-pay | Admitting: *Deleted

## 2016-06-21 NOTE — Telephone Encounter (Signed)
"  The reason for my call is that I had an appointment last Monday. He suggested surgery.  My surgery date is supposed to be April 24.  I called and left a message last week and no one called me back.  If you would call to confirm so I can make arrangements with my job."

## 2016-06-22 NOTE — Telephone Encounter (Signed)
I attempted to call the patient.  I left her a message that I had left her a message on April 4 that we had her scheduled for April 24.  I reiterated that we have her scheduled for April 24, and for her to call if she has any further questions.

## 2016-07-06 DIAGNOSIS — D2272 Melanocytic nevi of left lower limb, including hip: Secondary | ICD-10-CM | POA: Diagnosis not present

## 2016-07-06 DIAGNOSIS — M21612 Bunion of left foot: Secondary | ICD-10-CM | POA: Diagnosis not present

## 2016-07-06 DIAGNOSIS — D2122 Benign neoplasm of connective and other soft tissue of left lower limb, including hip: Secondary | ICD-10-CM | POA: Diagnosis not present

## 2016-07-06 DIAGNOSIS — M67472 Ganglion, left ankle and foot: Secondary | ICD-10-CM | POA: Diagnosis not present

## 2016-07-06 DIAGNOSIS — D492 Neoplasm of unspecified behavior of bone, soft tissue, and skin: Secondary | ICD-10-CM | POA: Diagnosis not present

## 2016-07-06 DIAGNOSIS — M2022 Hallux rigidus, left foot: Secondary | ICD-10-CM | POA: Diagnosis not present

## 2016-07-06 DIAGNOSIS — M2012 Hallux valgus (acquired), left foot: Secondary | ICD-10-CM | POA: Diagnosis not present

## 2016-07-13 ENCOUNTER — Encounter: Payer: Self-pay | Admitting: Podiatry

## 2016-07-16 ENCOUNTER — Ambulatory Visit (INDEPENDENT_AMBULATORY_CARE_PROVIDER_SITE_OTHER): Payer: 59

## 2016-07-16 ENCOUNTER — Encounter: Payer: Self-pay | Admitting: Podiatry

## 2016-07-16 ENCOUNTER — Ambulatory Visit (INDEPENDENT_AMBULATORY_CARE_PROVIDER_SITE_OTHER): Payer: 59 | Admitting: Podiatry

## 2016-07-16 VITALS — Temp 97.8°F

## 2016-07-16 DIAGNOSIS — M2022 Hallux rigidus, left foot: Secondary | ICD-10-CM | POA: Diagnosis not present

## 2016-07-16 DIAGNOSIS — M2010 Hallux valgus (acquired), unspecified foot: Secondary | ICD-10-CM

## 2016-07-16 DIAGNOSIS — M21619 Bunion of unspecified foot: Secondary | ICD-10-CM | POA: Diagnosis not present

## 2016-07-19 DIAGNOSIS — M5416 Radiculopathy, lumbar region: Secondary | ICD-10-CM | POA: Diagnosis not present

## 2016-07-19 NOTE — Progress Notes (Signed)
Subjective:    Patient ID: Kayla [name], female   DOB: 51 y.o.   MRN: [id number]   HPI patient states I'm doing well with minimal discomfort when I wear the boot with swelling if him on it too long    ROS      Objective:  Physical Exam Neurovascular status intact negative Homans sign was noted with patient's left foot doing well with hallux in rectus position and good range of motion with 30 dorsiflexion 20 plantar flexion with no pain crepitus and well-healed plantar site hallux    Assessment:    Doing well post osteotomy left and excision of lesion left     Plan:   X-ray reviewed and advised this patient on continued compression elevation immobilization and reappoint for [place] to recheck again in 2 weeks or earlier if needed   X-ray indicates satisfactory positioning of the first metatarsal with pins in place joint congruence and no pathology

## 2016-07-22 NOTE — Progress Notes (Signed)
DOS 07/06/2016 Bi-Plantar [place] left with fixation:removal soft tissue mass hallux

## 2016-07-27 ENCOUNTER — Telehealth: Payer: Self-pay | Admitting: Podiatry

## 2016-07-27 NOTE — Telephone Encounter (Signed)
I left a message for Kayla [name] with surgery date and to call if she needs additional information she would need to call and speak to [place] on a Wednesday or Thursday

## 2016-07-27 NOTE — Telephone Encounter (Signed)
Per Vm from [place] from [employer] called asking when pt had surgery so she can process pts disability claim

## 2016-07-29 DIAGNOSIS — M545 Low back pain: Secondary | ICD-10-CM | POA: Diagnosis not present

## 2016-07-30 ENCOUNTER — Ambulatory Visit (INDEPENDENT_AMBULATORY_CARE_PROVIDER_SITE_OTHER): Payer: Self-pay | Admitting: Podiatry

## 2016-07-30 ENCOUNTER — Ambulatory Visit (INDEPENDENT_AMBULATORY_CARE_PROVIDER_SITE_OTHER): Payer: 59

## 2016-07-30 DIAGNOSIS — M2022 Hallux rigidus, left foot: Secondary | ICD-10-CM

## 2016-07-30 DIAGNOSIS — M2010 Hallux valgus (acquired), unspecified foot: Secondary | ICD-10-CM | POA: Diagnosis not present

## 2016-07-30 MED ORDER — OXYCODONE-ACETAMINOPHEN 10-325 MG PO TABS: 1.0000 | ORAL_TABLET | ORAL | 0 refills | Status: DC | PRN

## 2016-07-30 NOTE — Progress Notes (Signed)
Subjective: Kayla [name] is a 51 y.o. is seen today in office s/p left foot [place] bunionectomy and excision of soft tissue mass preformed on 07/06/2016 with Dr. Paulla Dolly. They state their pain is still present but slowly improving. She has remained in the surgical shoe. No recent injury. Denies any systemic complaints such as fevers, chills, nausea, vomiting. No calf pain, chest pain, shortness of breath.   Objective: General: No acute distress, AAOx3  DP/PT pulses palpable 2/4, CRT < 3 sec to all digits.  Protective sensation intact. Motor function intact.  Left foot: Incision is well coapted without any evidence of dehiscence. There is no surrounding erythema, ascending cellulitis, fluctuance, crepitus, malodor, drainage/purulence. There is mild edema around the surgical site. There is mild pain along the surgical site. Toe is in rectus position.  No other areas of tenderness to bilateral lower extremities.  No other open lesions or pre-ulcerative lesions.  No pain with calf compression, swelling, warmth, erythema.   Assessment and Plan:  Status post left foot surgery, doing well with no complications   -Treatment options discussed including all alternatives, risks, and complications -X-rays were obtained and reviewed with the patient. Hardware intact. S/p bunionectomy. No evidence of acute fracture.  -Compression anklet dispensed.  -Remain in surgical shoe.  -Ice/elevation -Pain medication as needed- refilled today.  -Monitor for any clinical signs or symptoms of infection and DVT/PE and directed to call the office immediately should any occur or go to the ER. -Follow-up in 2 weeks  or sooner if any problems arise. In the meantime, encouraged to call the office with any questions, concerns, change in symptoms.   Celesta Gentile, DPM

## 2016-08-05 ENCOUNTER — Other Ambulatory Visit: Payer: Self-pay | Admitting: Internal Medicine

## 2016-08-05 ENCOUNTER — Encounter: Payer: Self-pay | Admitting: Podiatry

## 2016-08-05 DIAGNOSIS — Z1231 Encounter for screening mammogram for malignant neoplasm of breast: Secondary | ICD-10-CM

## 2016-08-13 ENCOUNTER — Ambulatory Visit: Payer: 59 | Admitting: Podiatry

## 2016-08-19 ENCOUNTER — Ambulatory Visit
Admission: RE | Admit: 2016-08-19 | Discharge: 2016-08-19 | Disposition: A | Discharge status: Home / Self Care | Payer: 59 | Source: Ambulatory Visit | Attending: Internal Medicine | Admitting: Internal Medicine

## 2016-08-19 DIAGNOSIS — Z1231 Encounter for screening mammogram for malignant neoplasm of breast: Secondary | ICD-10-CM | POA: Diagnosis not present

## 2016-08-20 ENCOUNTER — Encounter: Payer: Self-pay | Admitting: Podiatry

## 2016-08-20 ENCOUNTER — Ambulatory Visit (INDEPENDENT_AMBULATORY_CARE_PROVIDER_SITE_OTHER): Payer: 59 | Admitting: Podiatry

## 2016-08-20 ENCOUNTER — Ambulatory Visit (INDEPENDENT_AMBULATORY_CARE_PROVIDER_SITE_OTHER): Payer: 59

## 2016-08-20 DIAGNOSIS — M21621 Bunionette of right foot: Secondary | ICD-10-CM

## 2016-08-20 DIAGNOSIS — M2012 Hallux valgus (acquired), left foot: Secondary | ICD-10-CM

## 2016-08-20 DIAGNOSIS — M779 Enthesopathy, unspecified: Secondary | ICD-10-CM | POA: Diagnosis not present

## 2016-08-20 MED ORDER — TRIAMCINOLONE ACETONIDE 10 MG/ML IJ SUSP
10.0000 mg | Freq: Once | INTRAMUSCULAR | Status: AC
  Administered 2016-08-20: 10 mg

## 2016-08-20 NOTE — Progress Notes (Signed)
Subjective:    Patient ID: Kayla [name], female   DOB: 51 y.o.   MRN: [id number]   HPI patient states her left foot is doing great and she's getting discomfort again around the fifth MPJ right and she states it does not get better we'll probably require surgery    ROS      Objective:  Physical Exam neurovascular status intact muscle strength adequate with patient found to have good motion first MPJ left with mild edema wound edges well coapted good alignment and discomfort around the fifth MPJ right with fluid buildup around the joint surface     Assessment:    Taylor's bunion deformity right with inflammatory changes along with well-healing surgical site left first MPJ     Plan:    H&P conditions reviewed and we'll try 1 more time conservative care and I did careful injection around the fifth MPJ capsule are surface with 2 mg dexamethasone 2 mg Kenalog and advised on wider shoe gear. For the left continue with range of motion exercises and return to soft shoe gear  X-rays indicate the osteotomy is healing well with pins in place and no signs of movement

## 2016-08-23 ENCOUNTER — Other Ambulatory Visit: Payer: Self-pay | Admitting: Internal Medicine

## 2016-08-23 DIAGNOSIS — R928 Other abnormal and inconclusive findings on diagnostic imaging of breast: Secondary | ICD-10-CM

## 2016-08-25 ENCOUNTER — Encounter: Payer: Self-pay | Admitting: Podiatry

## 2016-08-25 ENCOUNTER — Ambulatory Visit
Admission: RE | Admit: 2016-08-25 | Discharge: 2016-08-25 | Disposition: A | Discharge status: Home / Self Care | Payer: 59 | Source: Ambulatory Visit | Attending: Internal Medicine | Admitting: Internal Medicine

## 2016-08-25 DIAGNOSIS — R928 Other abnormal and inconclusive findings on diagnostic imaging of breast: Secondary | ICD-10-CM

## 2016-10-15 ENCOUNTER — Ambulatory Visit (INDEPENDENT_AMBULATORY_CARE_PROVIDER_SITE_OTHER): Payer: 59 | Admitting: Podiatry

## 2016-10-15 ENCOUNTER — Ambulatory Visit (INDEPENDENT_AMBULATORY_CARE_PROVIDER_SITE_OTHER): Payer: 59

## 2016-10-15 DIAGNOSIS — Z472 Encounter for removal of internal fixation device: Secondary | ICD-10-CM

## 2016-10-15 DIAGNOSIS — M2012 Hallux valgus (acquired), left foot: Secondary | ICD-10-CM

## 2016-10-15 NOTE — Progress Notes (Signed)
Subjective:    Patient ID: Kayla [name], female   DOB: 51 y.o.   MRN: [id number]   HPI patient presents stating she started develop a lot of pain on top of the left foot and may have traumatized the pin and states it's hard to wear shoes    ROS      Objective:  Physical Exam neurovascular status intact negative Homans sign was noted with excellent alignment of the first MPJ great range of motion but prominent position on the left first metatarsal shaft     Assessment:    Doing well with surgery with prominent pin position left     Plan:    X-ray taken with marker indicating is directly on the metatarsal shaft which I explained to patient and I do think it we'll need to be removed due to pain. Patient wants surgery and I explained to them and at this time I allowed patient to read consent form reviewing surgery and all possible complications as outlined. Patient stands no guarantee understands PATIENT signs consent form and is scheduled for one pin removal and I may consider second if it is easy to get too and I don't have to do extensive dissection. Scheduled for outpatient surgery to be done in the office

## 2016-10-22 ENCOUNTER — Telehealth: Payer: Self-pay | Admitting: *Deleted

## 2016-10-22 NOTE — Telephone Encounter (Addendum)
I left patient a message to call me back to reschedule her office surgery appointment scheduled for 10/27/2016.

## 2016-10-25 ENCOUNTER — Encounter: Payer: Self-pay | Admitting: Podiatry

## 2016-10-28 NOTE — Telephone Encounter (Signed)
Patient came by the office and we rescheduled her office surgery to August 20 at 7:45 am.  (Removal kwire x2 left foot)

## 2016-11-01 ENCOUNTER — Encounter: Payer: Self-pay | Admitting: Podiatry

## 2016-11-01 ENCOUNTER — Ambulatory Visit (INDEPENDENT_AMBULATORY_CARE_PROVIDER_SITE_OTHER): Payer: 59 | Admitting: Podiatry

## 2016-11-01 VITALS — BP 120/88 | HR 89 | Resp 16

## 2016-11-01 DIAGNOSIS — Z472 Encounter for removal of internal fixation device: Secondary | ICD-10-CM | POA: Diagnosis not present

## 2016-11-01 NOTE — Progress Notes (Signed)
Subjective:    Patient ID: Kayla [name], female   DOB: 51 y.o.   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 I then went ahead and made a small neck incision in the dorsum of the left metatarsal shaft and I exposed tissue took it down through subcutaneous tissues tissue with hemostasis being acquired as necessary to capsule. Then made a small capsular incision and removed the lateral pin from the first metatarsal. I then went ahead and sutured with 5-0 nylon. Made a small proximal incision that I extended the incision proximal and took it down to capsule with hemostasis being acquired as necessary and then further made a small capsular incision. I identified the medial pin freed from all surrounding soft tissue attachments and removed in toto flushed the wound and sutured with 5-0 nylon and applied sterile dressing. Tourniquet was released and capillary fills noted to be immediate to all digits on the toe and the patient left heel in satisfactory condition

## 2016-11-03 DIAGNOSIS — E559 Vitamin D deficiency, unspecified: Secondary | ICD-10-CM | POA: Diagnosis not present

## 2016-11-03 DIAGNOSIS — Z0000 Encounter for general adult medical examination without abnormal findings: Secondary | ICD-10-CM | POA: Diagnosis not present

## 2016-11-04 DIAGNOSIS — Z712 Person consulting for explanation of examination or test findings: Secondary | ICD-10-CM | POA: Diagnosis not present

## 2016-11-04 DIAGNOSIS — Z0000 Encounter for general adult medical examination without abnormal findings: Secondary | ICD-10-CM | POA: Diagnosis not present

## 2016-11-12 ENCOUNTER — Ambulatory Visit (INDEPENDENT_AMBULATORY_CARE_PROVIDER_SITE_OTHER): Payer: 59

## 2016-11-12 ENCOUNTER — Ambulatory Visit (INDEPENDENT_AMBULATORY_CARE_PROVIDER_SITE_OTHER): Payer: Self-pay | Admitting: Podiatry

## 2016-11-12 ENCOUNTER — Encounter: Payer: Self-pay | Admitting: Podiatry

## 2016-11-12 DIAGNOSIS — Z472 Encounter for removal of internal fixation device: Secondary | ICD-10-CM | POA: Diagnosis not present

## 2016-11-12 NOTE — Progress Notes (Signed)
Subjective:    Patient ID: Kayla [name], female   DOB: 51 y.o.   MRN: [id number]   HPI patient states she's doing real well with minimal discomfort    ROS      Objective:  Physical Exam neurovascular status intact with patient's stitches intact wound edges well coapted dorsal left foot     Assessment:    Doing well post pin removal left     Plan:    Stitches removed x-rays reviewed patient is to be seen back on an as-needed basis  X-rays indicate that the bone is healing well and there is no indications of pathology

## 2016-11-26 ENCOUNTER — Ambulatory Visit (INDEPENDENT_AMBULATORY_CARE_PROVIDER_SITE_OTHER): Payer: 59

## 2016-11-26 ENCOUNTER — Encounter: Payer: Self-pay | Admitting: Podiatry

## 2016-11-26 ENCOUNTER — Ambulatory Visit (INDEPENDENT_AMBULATORY_CARE_PROVIDER_SITE_OTHER): Payer: 59 | Admitting: Podiatry

## 2016-11-26 DIAGNOSIS — L309 Dermatitis, unspecified: Secondary | ICD-10-CM

## 2016-11-26 DIAGNOSIS — M2012 Hallux valgus (acquired), left foot: Secondary | ICD-10-CM

## 2016-11-26 DIAGNOSIS — Z472 Encounter for removal of internal fixation device: Secondary | ICD-10-CM

## 2016-11-26 NOTE — Progress Notes (Signed)
Subjective:    Patient ID: Kayla [name], female   DOB: 51 y.o.   MRN: [id number]   HPI patient states has a small bump on the left first metatarsal and on the right foot is noted to have a small rash on the dorsal surface the patient's using cortisone    ROS      Objective:  Physical Exam neurovascular status intact with mild inflammation around the first metatarsal shaft left that's localized in nature and right small area of redness measuring about 1.5 x 2 cm     Assessment:  Doing well left with irritation probably of the incision site probable no bone trauma and rash right      Plan:     Continue medicine on the right reviewed x-ray left and explain hot cold therapy and if this does not get better we'll put small amount cortisone under it. Reappoint as needed  X-rays indicate that the osteotomy has healed well and there is no indications of pathology from where pin was removed

## 2016-12-13 DIAGNOSIS — J40 Bronchitis, not specified as acute or chronic: Secondary | ICD-10-CM | POA: Diagnosis not present

## 2016-12-13 DIAGNOSIS — J069 Acute upper respiratory infection, unspecified: Secondary | ICD-10-CM | POA: Diagnosis not present

## 2017-02-02 DIAGNOSIS — R51 Headache: Secondary | ICD-10-CM | POA: Diagnosis not present

## 2017-02-02 DIAGNOSIS — R0683 Snoring: Secondary | ICD-10-CM | POA: Diagnosis not present

## 2017-02-14 DIAGNOSIS — M5416 Radiculopathy, lumbar region: Secondary | ICD-10-CM | POA: Diagnosis not present

## 2017-02-17 DIAGNOSIS — R200 Anesthesia of skin: Secondary | ICD-10-CM | POA: Diagnosis not present

## 2017-02-23 DIAGNOSIS — M1712 Unilateral primary osteoarthritis, left knee: Secondary | ICD-10-CM | POA: Diagnosis not present

## 2017-02-23 DIAGNOSIS — M25571 Pain in right ankle and joints of right foot: Secondary | ICD-10-CM | POA: Diagnosis not present

## 2017-02-23 DIAGNOSIS — M25561 Pain in right knee: Secondary | ICD-10-CM | POA: Diagnosis not present

## 2017-02-23 DIAGNOSIS — M25572 Pain in left ankle and joints of left foot: Secondary | ICD-10-CM | POA: Diagnosis not present

## 2017-02-23 DIAGNOSIS — M25562 Pain in left knee: Secondary | ICD-10-CM | POA: Diagnosis not present

## 2017-02-28 DIAGNOSIS — M545 Low back pain: Secondary | ICD-10-CM | POA: Diagnosis not present

## 2017-02-28 DIAGNOSIS — M5416 Radiculopathy, lumbar region: Secondary | ICD-10-CM | POA: Diagnosis not present

## 2017-03-01 DIAGNOSIS — M5416 Radiculopathy, lumbar region: Secondary | ICD-10-CM | POA: Diagnosis not present

## 2017-04-27 ENCOUNTER — Encounter: Payer: Self-pay | Admitting: Neurology

## 2017-04-28 ENCOUNTER — Encounter (INDEPENDENT_AMBULATORY_CARE_PROVIDER_SITE_OTHER): Payer: Self-pay

## 2017-04-28 ENCOUNTER — Ambulatory Visit: Payer: Managed Care, Other (non HMO) | Admitting: Neurology

## 2017-04-28 ENCOUNTER — Encounter: Payer: Self-pay | Admitting: Neurology

## 2017-04-28 VITALS — BP 123/81 | HR 73 | Ht 65.0 in | Wt 241.0 lb

## 2017-04-28 DIAGNOSIS — G4719 Other hypersomnia: Secondary | ICD-10-CM | POA: Diagnosis not present

## 2017-04-28 DIAGNOSIS — E66813 Obesity, class 3: Secondary | ICD-10-CM

## 2017-04-28 DIAGNOSIS — E6609 Other obesity due to excess calories: Secondary | ICD-10-CM | POA: Insufficient documentation

## 2017-04-28 DIAGNOSIS — R51 Headache: Secondary | ICD-10-CM | POA: Diagnosis not present

## 2017-04-28 DIAGNOSIS — G4710 Hypersomnia, unspecified: Secondary | ICD-10-CM

## 2017-04-28 DIAGNOSIS — R519 Headache, unspecified: Secondary | ICD-10-CM

## 2017-04-28 DIAGNOSIS — K219 Gastro-esophageal reflux disease without esophagitis: Secondary | ICD-10-CM | POA: Diagnosis not present

## 2017-04-28 DIAGNOSIS — G4730 Sleep apnea, unspecified: Secondary | ICD-10-CM

## 2017-04-28 DIAGNOSIS — Z6841 Body Mass Index (BMI) 40.0 and over, adult: Secondary | ICD-10-CM | POA: Diagnosis not present

## 2017-04-28 DIAGNOSIS — E66812 Obesity, class 2: Secondary | ICD-10-CM | POA: Insufficient documentation

## 2017-04-28 DIAGNOSIS — R0681 Apnea, not elsewhere classified: Secondary | ICD-10-CM | POA: Diagnosis not present

## 2017-04-28 NOTE — Patient Instructions (Signed)
Hypersomnia Hypersomnia is when you feel extremely tired during the day even though you're getting plenty of sleep at night. You may need to take naps during the day, and you may also be extremely difficult to wake up when you are sleeping. What are the causes? The cause of your hypersomnia may not be known. Hypersomnia may be caused by:  Medicines.  Sleep disorders, such as narcolepsy.  Trauma or injury to your head or nervous system.  Using drugs or alcohol.  Tumors.  Medical conditions, such as depression or hypothyroidism.  Genetics.  What are the signs or symptoms? The main symptoms of hypersomnia include:  Feeling extremely tired throughout the day.  Being very difficult to wake up.  Sleeping for longer and longer periods.  Taking naps throughout the day.  Other symptoms may include:  Feeling: ? Restless. ? Annoyed. ? Anxious. ? Low energy.  Having difficulty: ? Remembering. ? Speaking. ? Thinking.  Losing your appetite.  Experiencing hallucinations.  How is this diagnosed? Hypersomnia may be diagnosed by:  Medical history and physical exam. This will include a sleep history.  Completing sleep logs.  Tests may also be done, such as: ? Polysomnography. ? Multiple sleep latency test (MSLT).  How is this treated? There is no cure for hypersomnia, but treatment can be very effective in helping manage the condition. Treatment may include:  Lifestyle and sleeping strategies to help cope with the condition.  Stimulant medicines.  Treating any underlying causes of hypersomnia.  Follow these instructions at home:  Take medicines only as directed by your health care provider.  Schedule short naps for when you feel sleepiest during the day. Tell your employer or teachers that you have hypersomnia. You may be able to adjust your schedule to include time for naps.  Avoid drinking alcohol or caffeinated beverages.  Do not eat a heavy meal before  bedtime. Eat at about the same times every day.  Do not drive or operate heavy machinery if you are sleepy.  Do not swim or go out on the water without a life jacket.  If possible, adjust your schedule so that you do not have to work or be active at night.  Keep all follow-up visits as directed by your health care provider. This is important. Contact a health care provider if:  You have new symptoms.  Your symptoms get worse. Get help right away if: You have serious thoughts of hurting yourself or someone else. This information is not intended to replace advice given to you by your health care provider. Make sure you discuss any questions you have with your health care provider. Document Released: 02/19/2002 Document Revised: 08/07/2015 Document Reviewed: 10/04/2013 Elsevier Interactive Patient Education  2018 Elsevier Inc.  
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
## 2017-05-16 ENCOUNTER — Telehealth: Payer: Self-pay | Admitting: Neurology

## 2017-05-16 NOTE — Telephone Encounter (Signed)
We have attempted to call the patient two times to schedule sleep study. Patient has been unavailable at the phone numbers we have on file, and has not returned our calls. At this time, we will send a letter asking patient to please contact the sleep lab.  °

## 2017-06-08 ENCOUNTER — Ambulatory Visit (INDEPENDENT_AMBULATORY_CARE_PROVIDER_SITE_OTHER): Payer: Managed Care, Other (non HMO) | Admitting: Neurology

## 2017-06-08 DIAGNOSIS — R0681 Apnea, not elsewhere classified: Secondary | ICD-10-CM

## 2017-06-08 DIAGNOSIS — G4710 Hypersomnia, unspecified: Secondary | ICD-10-CM | POA: Diagnosis not present

## 2017-06-08 DIAGNOSIS — R519 Headache, unspecified: Secondary | ICD-10-CM

## 2017-06-08 DIAGNOSIS — R51 Headache: Secondary | ICD-10-CM

## 2017-06-08 DIAGNOSIS — K219 Gastro-esophageal reflux disease without esophagitis: Secondary | ICD-10-CM

## 2017-06-08 DIAGNOSIS — Z6841 Body Mass Index (BMI) 40.0 and over, adult: Secondary | ICD-10-CM

## 2017-06-08 DIAGNOSIS — G4719 Other hypersomnia: Secondary | ICD-10-CM

## 2017-06-08 DIAGNOSIS — G4730 Sleep apnea, unspecified: Principal | ICD-10-CM

## 2017-06-16 NOTE — Procedures (Signed)
[facility] [address] [place], [place] [address] NAME:    Kayla [name]                                                      DOB: 03/31/1965 MEDICAL RECORD NUMBER [id number]                                     DOS: 06/08/2017 REFERRING PHYSICIAN: Glendale Chard, M.D. STUDY PERFORMED: Home Sleep Test HISTORY: [name] is a 52 y.o. female with a complaint of witnessed snoring, headaches in the morning, and excessive daytime sleepiness.  She has gained weight over the last 3 years, has suffered from gastroparesis with and without migraine, depression and anxiety. Just recently she had a friend staying over who reported that she snore thunderously.  She also has noticed a tendency to get bronchitis, tracheitis, and sinusitis and she wakes up with a parched mouth. She underwent a sleep study in 2013 at the [facility]. and was told she had mild apnea. She reported she couldn't sleep because of the dust in the bedroom.  Epworth score endorsed at 23/ 24 points, Fatigue severity score at 58 points. BMI was 40.1.  STUDY RESULTS:  Total Recording Time: 7 hours, 27 minutes Total Apnea/Hypopnea Index (AHI): 5.0 /h; RDI was 9.2 /h Average Oxygen Saturation:  94 %; Lowest Oxygen Desaturation: 78 %  Total Time in Oxygen Saturation below 89 % was 3 minutes.  Average Heart Rate:  74 bpm (between 45 and 113 bpm). IMPRESSION: Very Mild Sleep Apnea, 67% OSA and 33% CSA. Snoring was noted, but not the degree of snoring. There was no prolonged hypoxemia noted.  The Pulse rate varied between brady - and tachycardia.   RECOMMENDATION: This HST's result does not reflect the patient reported level of sleep disordered breathing and could not explain her excessive daytime sleepiness.  I will follow up with a PSG: if she has apnea, will treat. If not she may have to stay for MSLT.   I certify that I have reviewed the raw data recording prior to the issuance of this report in  accordance with the standards of the American Academy of Sleep Medicine (AASM).  Larey Seat, M.D.    06-16-2017  Medical Director of [facility], accredited by the AASM. Diplomat of the ABPN and ABSM.

## 2017-06-16 NOTE — Addendum Note (Signed)
Addended by: Larey Seat on: 06/16/2017 06:48 PM   Modules accepted: Orders

## 2017-06-17 ENCOUNTER — Telehealth: Payer: Self-pay | Admitting: Neurology

## 2017-06-17 NOTE — Telephone Encounter (Signed)
-----   Message from Larey Seat, MD sent at 06/16/2017  6:48 PM EDT ----- IMPRESSION: Very Mild Sleep Apnea, 67% OSA and 33% CSA. Snoring  was noted, but not the degree of snoring. There was no prolonged  hypoxemia noted.  The Pulse rate varied between brady - and tachycardia.  RECOMMENDATION: This HST's result does not reflect the patient  reported level of sleep disordered breathing and could not  explain her excessive daytime sleepiness.  I will follow up with a PSG: if she has apnea, will treat. If not  she may have to stay for MSLT.   Cc Dr Baird Cancer

## 2017-06-17 NOTE — Telephone Encounter (Signed)
Called patient to discuss sleep study results. No answer at this time. LVM for the patient to call back.   

## 2017-06-22 NOTE — Telephone Encounter (Signed)
Made 2nd attempt to call the pt. No answer. LVM for the pt to call back.  

## 2017-06-27 ENCOUNTER — Other Ambulatory Visit: Payer: Self-pay | Admitting: Podiatry

## 2017-06-27 ENCOUNTER — Ambulatory Visit (INDEPENDENT_AMBULATORY_CARE_PROVIDER_SITE_OTHER): Payer: Managed Care, Other (non HMO)

## 2017-06-27 ENCOUNTER — Ambulatory Visit: Payer: Managed Care, Other (non HMO) | Admitting: Podiatry

## 2017-06-27 ENCOUNTER — Encounter: Payer: Self-pay | Admitting: Podiatry

## 2017-06-27 DIAGNOSIS — M722 Plantar fascial fibromatosis: Secondary | ICD-10-CM

## 2017-06-27 DIAGNOSIS — M779 Enthesopathy, unspecified: Secondary | ICD-10-CM

## 2017-06-27 DIAGNOSIS — M7751 Other enthesopathy of right foot: Secondary | ICD-10-CM

## 2017-06-29 ENCOUNTER — Telehealth: Payer: Self-pay

## 2017-06-29 NOTE — Telephone Encounter (Signed)
Insurance has denied NPSG/MSLT due to pt has met their criteria (AHI of 5) to be placed on AutoPAP at home. You will need to order this and pt will need to treat OSA first before they will approve pt to come back for narcolepsy testing.

## 2017-06-29 NOTE — Telephone Encounter (Signed)
Made 3rd attempt at calling the pt today to review sleep study and go over starting on auto cpap. No answer. LVM for the patient if she doesn't call back will send a letter.

## 2017-06-30 ENCOUNTER — Encounter: Payer: Self-pay | Admitting: Neurology

## 2017-07-01 NOTE — Progress Notes (Signed)
   HPI: 52 year old female presenting today with a chief complaint of pain to the lateral right foot that began 6 weeks ago. She reports associated burning and discoloration of the area. She has been seen by Dr. Paulla Dolly in the past for this and was treated with an injection that helped alleviate the pain. Walking increases the pain. She has not done anything to treat the symptoms. Patient is here for further evaluation and treatment.   Past Medical History:  Diagnosis Date  . Arthritis   . Chronic back pain   . Chronic neck pain   . Chronic shoulder pain    left  . Knee pain   . Migraine   . Sciatica      Physical Exam: General: The patient is alert and oriented x3 in no acute distress.  Dermatology: Skin is warm, dry and supple bilateral lower extremities. Negative for open lesions or macerations.  Vascular: Palpable pedal pulses bilaterally. No edema or erythema noted. Capillary refill within normal limits.  Neurological: Epicritic and protective threshold grossly intact bilaterally.   Musculoskeletal Exam: Range of motion within normal limits to all pedal and ankle joints bilateral. Muscle strength 5/5 in all groups bilateral.   Radiographic Exam:  Normal osseous mineralization. Joint spaces preserved. No fracture/dislocation/boney destruction.    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 Patient evaluated. X-Rays reviewed.  2. Injection of 0.5 mLs Celestone Soluspan injected into the 5th MPJ of the right foot.  3. Patient wants surgery done eventually but is not ready at this time.  4. Recommended wide fitting shoe gear.  5. Return to clinic as needed.       Edrick Kins, DPM [facility]  Dr. Edrick Kins, DPM    [address], [place] [address]                Office [phone]  Fax [phone]

## 2017-07-19 ENCOUNTER — Other Ambulatory Visit: Payer: Self-pay | Admitting: Neurology

## 2017-07-19 DIAGNOSIS — G4730 Sleep apnea, unspecified: Secondary | ICD-10-CM

## 2017-07-19 DIAGNOSIS — G4719 Other hypersomnia: Secondary | ICD-10-CM

## 2017-07-19 NOTE — Telephone Encounter (Signed)
Pt has returned call. I advised pt that Dr. Brett Fairy reviewed their sleep study results and found that pt has mild sleep apnea. Dr. Brett Fairy recommends that pt come in for a sleep test in our lab, but her insurance denied the in lab study.We will try a auto CPAP first to see if this treats her apnea. I reviewed PAP compliance expectations with the pt. Pt is agreeable to starting a CPAP. I advised pt that an order will be sent to a DME, [facility], and [facility] will call the pt within about one week after they file with the pt's insurance. [facility] will show the pt how to use the machine, fit for masks, and troubleshoot the CPAP if needed. A follow up appt was made for insurance purposes with Cecille Rubin, NP on Oct 27, 2017 at 9:15 am. Pt verbalized understanding to arrive 15 minutes early and bring their CPAP. A letter with all of this information in it will be mailed to the pt as a reminder. I verified with the pt that the address we have on file is correct. Pt verbalized understanding of results. Pt had no questions at this time but was encouraged to call back if questions arise.

## 2017-10-20 ENCOUNTER — Telehealth: Payer: Self-pay | Admitting: Neurology

## 2017-10-20 NOTE — Telephone Encounter (Signed)
Called the patient and informed her that she has to be seen by aug 28,2019 in order to stay within her dates of being seen. I can open a 8:30 am slot on either 8/20 or 8/21 for the patient with check in of 8 am. I LVM informing the patient to call back with which date she would like to do and I can plug her in on that slot when she informs of which date will work for her.

## 2017-10-20 NOTE — Telephone Encounter (Signed)
Pt called to move appt up to within the time frame for a CPAP follow up. Throughout MM, CM and Dr. Brett Fairy I was unable to find an opening. Pt requesting a call to discuss being worked in with the [employer]

## 2017-10-21 NOTE — Telephone Encounter (Signed)
Called the pt again today, she needs to be seen by 8/28 for CPAP follow up. There were cancellations on Ward Givens, NP schedule was going to offer. Encouraged the patient to call back so we could get her scheduled. Can be seen with MD or either NP before 11/09/17

## 2017-10-24 ENCOUNTER — Encounter: Payer: Self-pay | Admitting: Neurology

## 2017-10-25 NOTE — Telephone Encounter (Signed)
Pt called back, she accepted appt tomorrow with Dr D at 1:00, ckin @ 12:30 FYI

## 2017-10-26 ENCOUNTER — Encounter

## 2017-10-26 ENCOUNTER — Other Ambulatory Visit: Payer: Self-pay | Admitting: Internal Medicine

## 2017-10-26 ENCOUNTER — Ambulatory Visit: Payer: Managed Care, Other (non HMO) | Admitting: Neurology

## 2017-10-26 ENCOUNTER — Encounter: Payer: Self-pay | Admitting: Neurology

## 2017-10-26 VITALS — BP 136/90 | HR 74 | Ht 65.0 in | Wt 228.0 lb

## 2017-10-26 DIAGNOSIS — Z6841 Body Mass Index (BMI) 40.0 and over, adult: Secondary | ICD-10-CM

## 2017-10-26 DIAGNOSIS — Z1231 Encounter for screening mammogram for malignant neoplasm of breast: Secondary | ICD-10-CM

## 2017-10-26 DIAGNOSIS — K219 Gastro-esophageal reflux disease without esophagitis: Secondary | ICD-10-CM | POA: Diagnosis not present

## 2017-10-26 DIAGNOSIS — R519 Headache, unspecified: Secondary | ICD-10-CM

## 2017-10-26 DIAGNOSIS — G4710 Hypersomnia, unspecified: Secondary | ICD-10-CM

## 2017-10-26 DIAGNOSIS — R51 Headache: Secondary | ICD-10-CM

## 2017-10-26 DIAGNOSIS — R0681 Apnea, not elsewhere classified: Secondary | ICD-10-CM

## 2017-10-26 DIAGNOSIS — G4719 Other hypersomnia: Secondary | ICD-10-CM
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
## 2017-10-27 ENCOUNTER — Ambulatory Visit: Payer: Self-pay | Admitting: Nurse Practitioner

## 2017-11-02 ENCOUNTER — Telehealth: Payer: Self-pay | Admitting: Neurology

## 2017-11-02 LAB — NARCOLEPSY EVALUATION
DQA1*01:02: NEGATIVE
DQB1*06:02: NEGATIVE

## 2017-11-02 NOTE — Telephone Encounter (Signed)
-----  Message from Larey Seat, MD sent at 11/02/2017  8:45 AM EDT ----- HLA negative for narcolepsy alleles. Please share with PCP, Dr. Glendale Chard, MD  Larey Seat, MD

## 2017-11-02 NOTE — Telephone Encounter (Signed)
Called the patient and informed her that her lab work was negative for carrying the gene for narcolepsy. The patient would like to move forward with competing the testing for ruling out narcolepsy. Informed the patient I will make the sleep lab aware and they will reach out to schedule once insurance approval has taken place. Pt verbalized understanding.

## 2017-11-22 ENCOUNTER — Ambulatory Visit: Payer: 59

## 2017-11-24 DIAGNOSIS — Z720 Tobacco use: Secondary | ICD-10-CM | POA: Diagnosis not present

## 2017-11-24 DIAGNOSIS — Z0000 Encounter for general adult medical examination without abnormal findings: Secondary | ICD-10-CM | POA: Diagnosis not present

## 2017-11-24 DIAGNOSIS — F329 Major depressive disorder, single episode, unspecified: Secondary | ICD-10-CM

## 2017-11-24 DIAGNOSIS — Z86010 Personal history of colonic polyps: Secondary | ICD-10-CM | POA: Diagnosis not present

## 2017-12-19 ENCOUNTER — Ambulatory Visit: Payer: Self-pay

## 2018-01-12 ENCOUNTER — Ambulatory Visit: Payer: Managed Care, Other (non HMO) | Admitting: Neurology

## 2018-01-27 ENCOUNTER — Encounter: Payer: Self-pay | Admitting: Neurology

## 2018-01-27 ENCOUNTER — Ambulatory Visit (INDEPENDENT_AMBULATORY_CARE_PROVIDER_SITE_OTHER): Payer: Managed Care, Other (non HMO) | Admitting: Nurse Practitioner

## 2018-01-27 ENCOUNTER — Encounter: Payer: Self-pay | Admitting: Nurse Practitioner

## 2018-01-27 VITALS — BP 114/80 | HR 65 | Temp 98.5°F | Ht 65.0 in | Wt 224.2 lb

## 2018-01-27 DIAGNOSIS — R42 Dizziness and giddiness: Secondary | ICD-10-CM

## 2018-01-27 DIAGNOSIS — Z23 Encounter for immunization: Secondary | ICD-10-CM | POA: Diagnosis not present

## 2018-01-27 DIAGNOSIS — M792 Neuralgia and neuritis, unspecified: Secondary | ICD-10-CM | POA: Diagnosis not present

## 2018-01-27 DIAGNOSIS — J302 Other seasonal allergic rhinitis: Secondary | ICD-10-CM

## 2018-01-27 MED ORDER — OLOPATADINE HCL 0.2 % OP SOLN: 1.0000 [drp] | Freq: Once | OPHTHALMIC | 0 refills | Status: AC

## 2018-01-27 MED ORDER — PREDNISONE 10 MG (21) PO TBPK: ORAL_TABLET | ORAL | 0 refills | Status: DC

## 2018-01-27 MED ORDER — CETIRIZINE HCL 10 MG PO TABS: 10.0000 mg | ORAL_TABLET | Freq: Every day | ORAL | 1 refills | Status: DC

## 2018-01-27 NOTE — Telephone Encounter (Signed)
I left a voicemail for the pt letting her know that we can get her rescheduled for sleep study, however her PA runs out on 11/24. We are able to get her in before that date. We will submit for a new PA from [place] once we hear back from them we will call to get her rescheduled.

## 2018-01-27 NOTE — Patient Instructions (Signed)
Occipital Neuralgia Occipital neuralgia is a type of headache that causes episodes of very bad pain in the back of your head. Pain from occipital neuralgia may spread (radiate) to other parts of your head. The pain is usually brief and often goes away after you rest and relax. These headaches may be caused by irritation of the nerves that leave your spinal cord high up in your neck, just below the base of your skull (occipital nerves). Your occipital nerves transmit sensations from the back of your head, the top of your head, and the areas behind your ears. What are the causes? Occipital neuralgia can occur without any known cause (primary headache syndrome). In other cases, occipital neuralgia is caused by pressure on or irritation of one of the two occipital nerves. Causes of occipital nerve compression or irritation include:  Wear and tear of the vertebrae in the neck (osteoarthritis).  Neck injury.  Disease of the disks that separate the vertebrae.  Tumors.  Gout.  Infections.  Diabetes.  Swollen blood vessels that put pressure on the occipital nerves.  Muscle spasm in the neck.  What are the signs or symptoms? Pain is the main symptom of occipital neuralgia. It usually starts in the back of the head but may also be felt in other areas supplied by the occipital nerves. Pain is usually on one side but may be on both sides. You may have:  Brief episodes of very bad pain that is burning, stabbing, shocking, or shooting.  Pain behind the eye.  Pain triggered by neck movement or hair brushing.  Scalp tenderness.  Aching in the back of the head between episodes of very bad pain.  How is this diagnosed? Your health care provider may diagnose occipital neuralgia based on your symptoms and a physical exam. During the exam, the health care provider may push on areas supplied by the occipital nerves to see if they are painful. Some tests may also be done to help in making the  diagnosis. These may include:  Imaging studies of the upper spinal cord, such as an MRI or CT scan. These may show compression or spinal cord abnormalities.  Nerve block. You will get an injection of numbing medicine (local anesthetic) near the occipital nerve to see if this relieves pain.  How is this treated? Treatment may begin with simple measures, such as:  Rest.  Massage.  Heat.  Over-the-counter pain relievers.  If these measures do not work, you may need other treatments, including:  Medicines such as: ? Prescription-strength anti-inflammatory medicines. ? Muscle relaxants. ? Antiseizure medicines. ? Antidepressants.  Steroid injection. This involves injections of local anesthetic and strong anti-inflammatory drugs (steroids).  Pulsed radiofrequency. Wires are implanted to deliver electrical impulses that block pain signals from the occipital nerve.  Physical therapy.  Surgery to relieve nerve pressure.  Follow these instructions at home:  Take all medicines as directed by your health care provider.  Avoid activities that cause pain.  Rest when you have an attack of pain.  Try gentle massage or a heating pad to relieve pain.  Work with a physical therapist to learn stretching exercises you can do at home.  Try a different pillow or sleeping position.  Practice good posture.  Try to stay active. Get regular exercise that does not cause pain. Ask your health care provider to suggest safe exercises for you.  Keep all follow-up visits as directed by your health care provider. This is important. Contact a health care provider if:    Your medicine is not working.  You have new or worsening symptoms. Get help right away if:  You have very bad head pain that is not going away.  You have a sudden change in vision, balance, or speech. This information is not intended to replace advice given to you by your health care provider. Make sure you discuss any  questions you have with your health care provider. Document Released: 02/23/2001 Document Revised: 08/07/2015 Document Reviewed: 02/21/2013 Elsevier Interactive Patient Education  2017 Elsevier Inc.  
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
## 2018-03-21 ENCOUNTER — Ambulatory Visit
Admission: RE | Admit: 2018-03-21 | Discharge: 2018-03-21 | Disposition: A | Discharge status: Home / Self Care | Payer: Managed Care, Other (non HMO) | Source: Ambulatory Visit | Attending: Internal Medicine | Admitting: Internal Medicine

## 2018-03-21 DIAGNOSIS — Z1231 Encounter for screening mammogram for malignant neoplasm of breast: Secondary | ICD-10-CM

## 2018-04-06 IMAGING — MG 2D DIGITAL DIAGNOSTIC UNILATERAL LEFT MAMMOGRAM WITH CAD AND ADJ
6 series · 6 of 14 positions shown · non-contrast
Comparison: Previous exam(s).

CLINICAL DATA: Patient was called back from screening mammogram for
a possible asymmetry in the left breast.

EXAM:
2D DIGITAL DIAGNOSTIC UNILATERAL LEFT MAMMOGRAM WITH CAD AND ADJUNCT
TOMO

[L MLO]
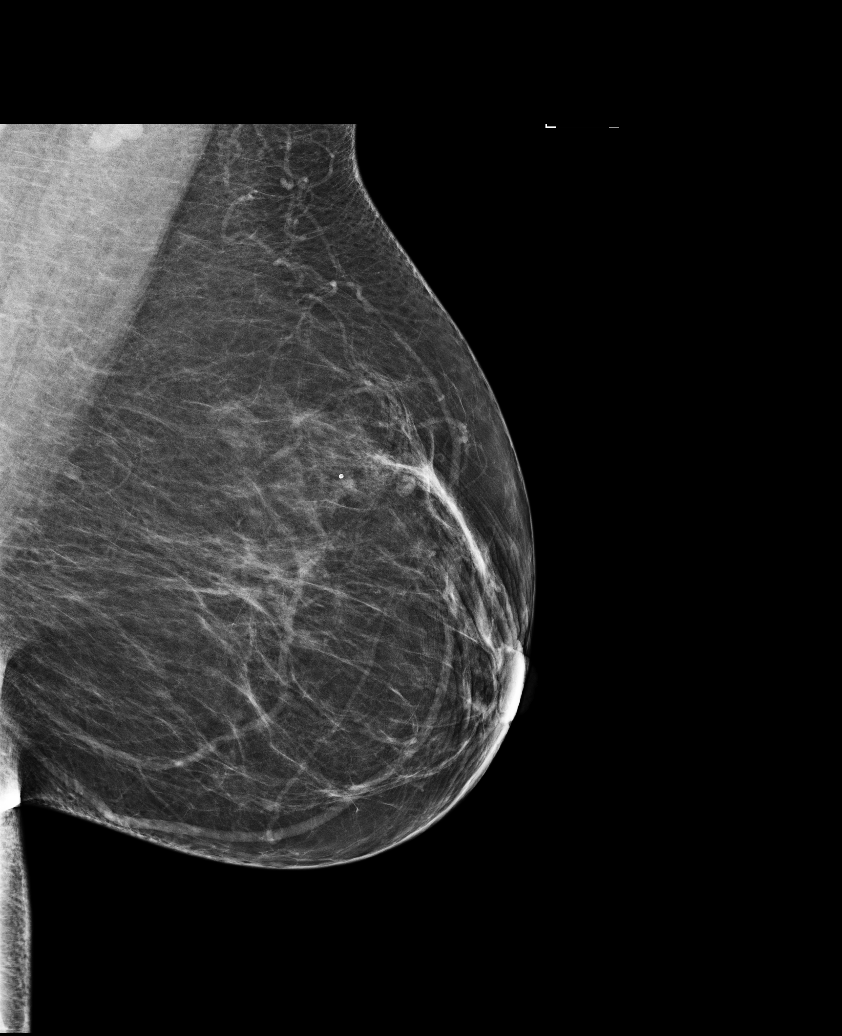

[L CC]
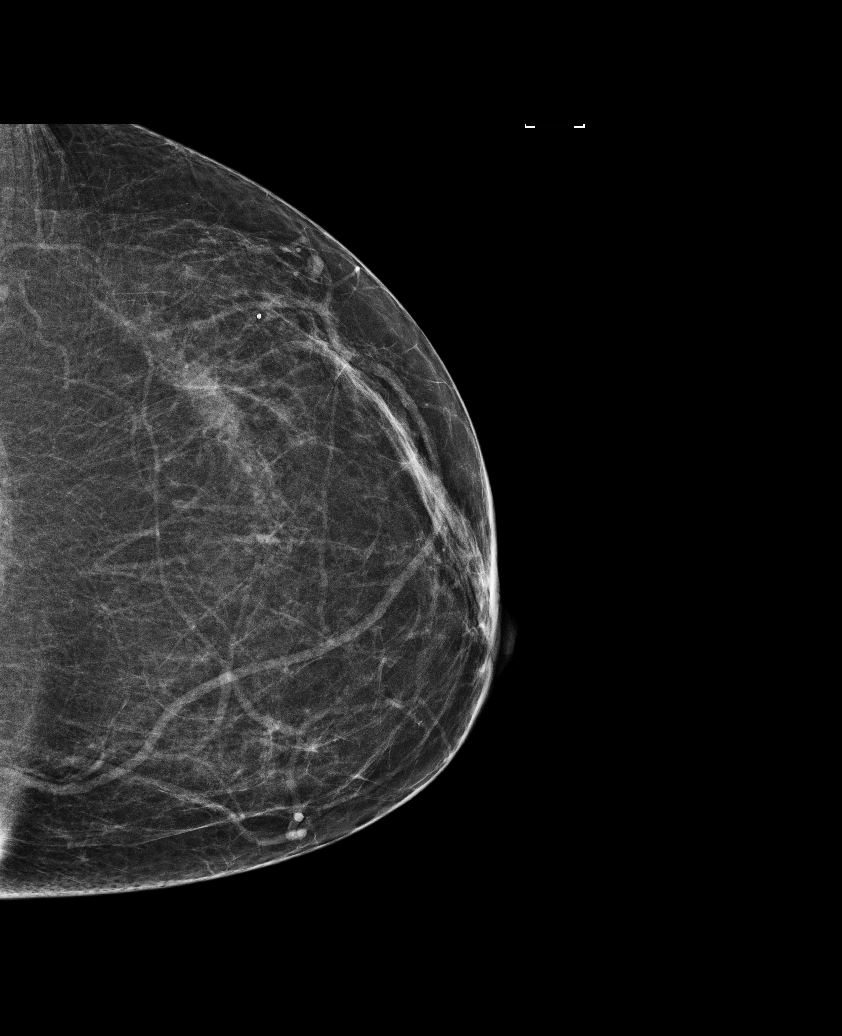

[L CC synth-2D]
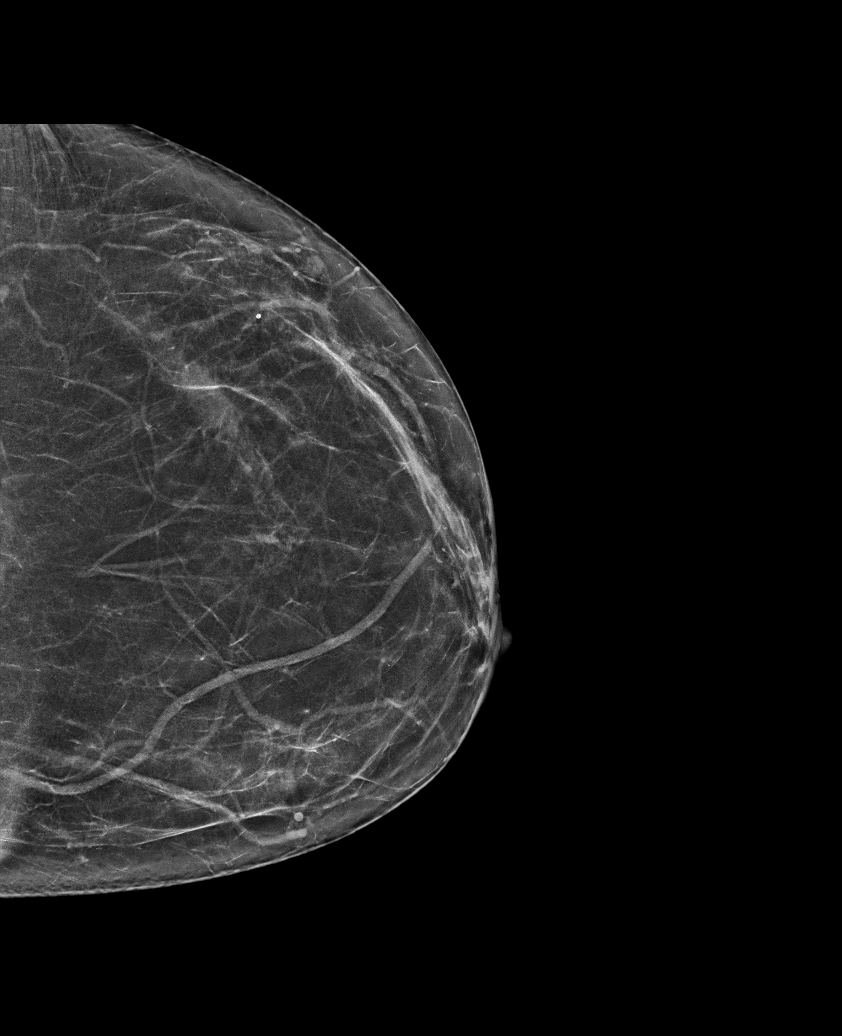

[L MLO synth-2D]
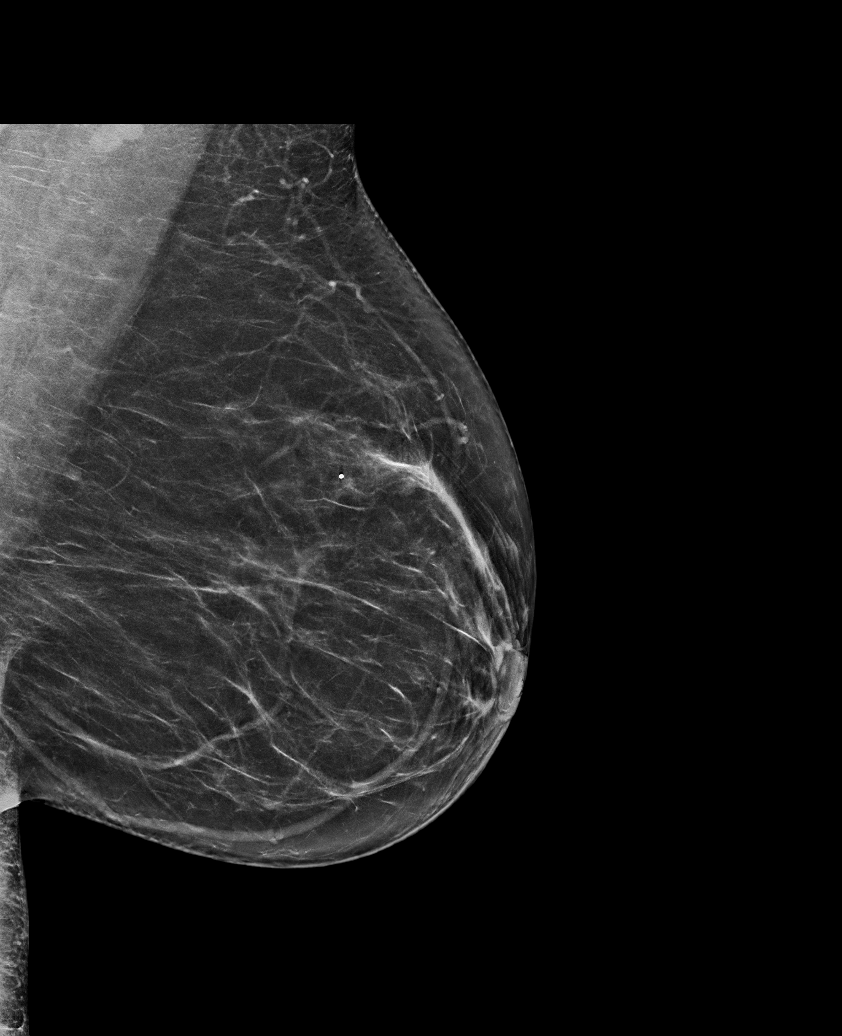

[L MLO tomo · tomo slice 39/78.0]
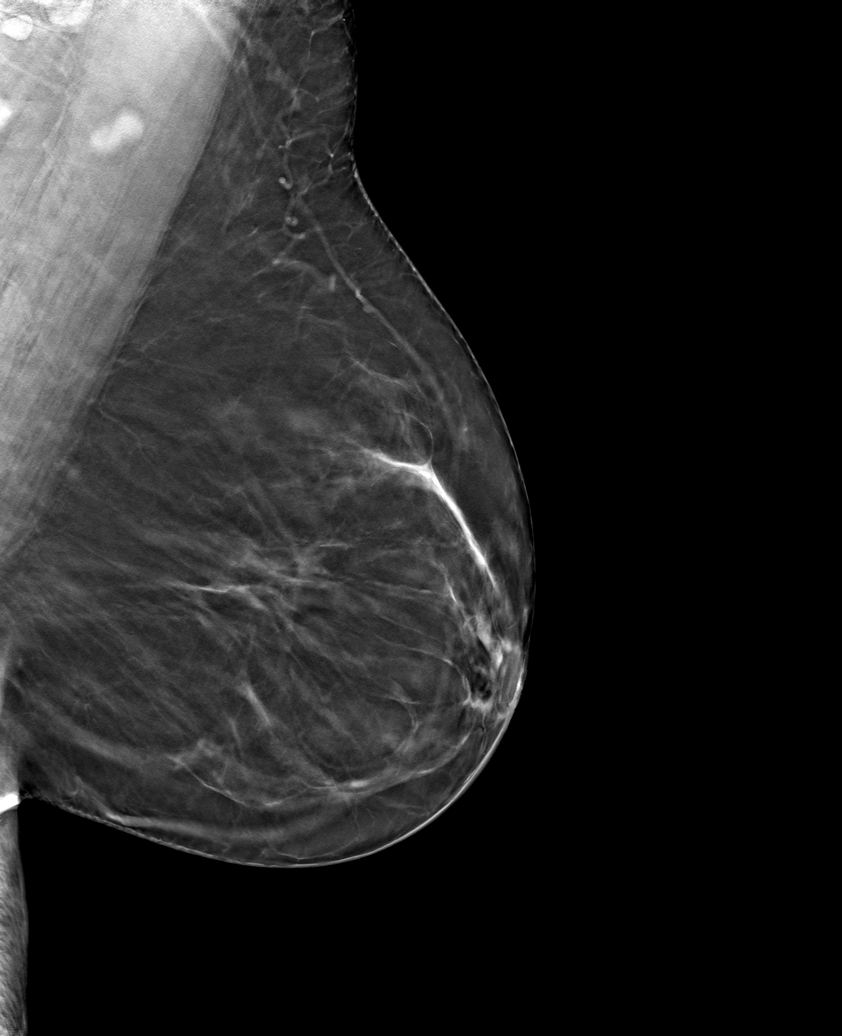

[L CC tomo · tomo slice 34/67.0]
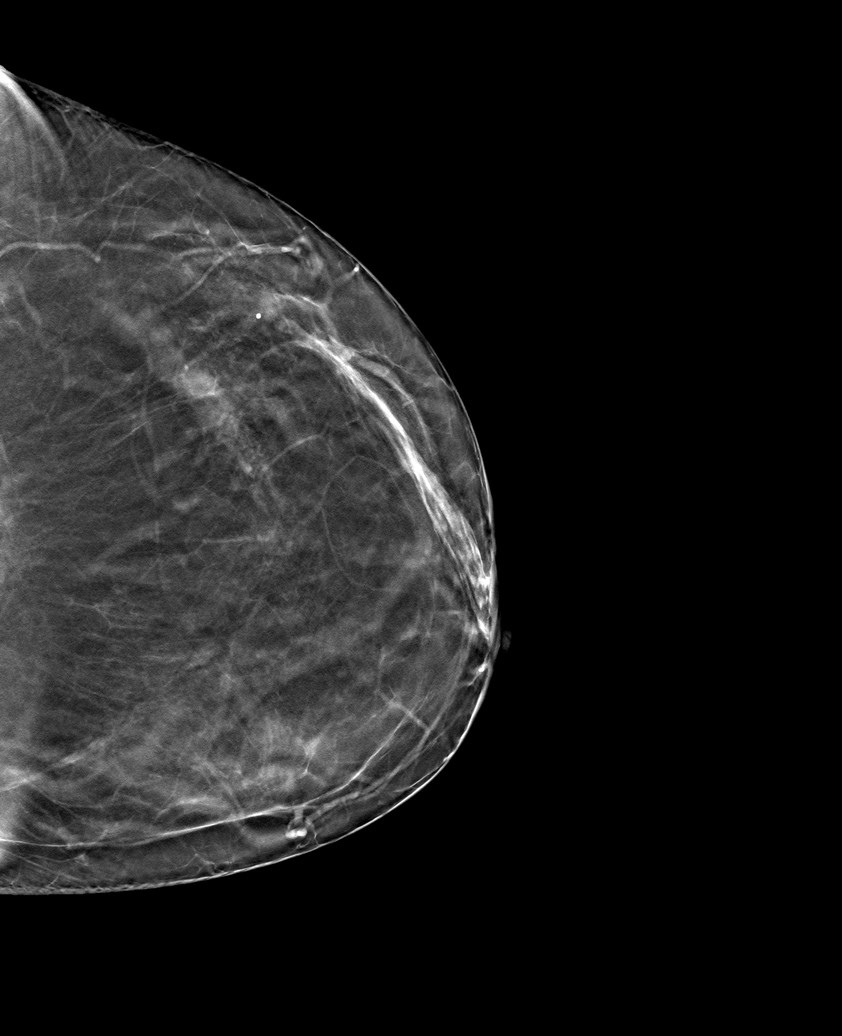

[6 of 14 positions shown; findings below may reference images not displayed]

ACR Breast Density Category b: There are scattered areas of
fibroglandular density.
FINDINGS: Additional imaging of the left breast was performed. Parenchymal
pattern is unchanged from prior exams. No new suspicious mass or
malignant type microcalcifications identified.

Mammographic images were processed with CAD.
IMPRESSION: No evidence of malignancy in the left breast.

RECOMMENDATION:
Bilateral screening mammogram in 1 year is recommended.

I have discussed the findings and recommendations with the patient.
Results were also provided in writing at the conclusion of the
visit. If applicable, a reminder letter will be sent to the patient
regarding the next appointment.

BI-RADS CATEGORY  1: Negative.

## 2018-05-02 ENCOUNTER — Ambulatory Visit: Payer: Managed Care, Other (non HMO) | Admitting: Neurology

## 2018-05-25 ENCOUNTER — Ambulatory Visit: Payer: Managed Care, Other (non HMO) | Admitting: Internal Medicine

## 2018-05-25 ENCOUNTER — Other Ambulatory Visit: Payer: Self-pay

## 2018-05-25 ENCOUNTER — Encounter: Payer: Self-pay | Admitting: Internal Medicine

## 2018-05-25 VITALS — BP 122/84 | HR 83 | Temp 98.4°F | Ht 65.0 in | Wt 231.4 lb

## 2018-05-25 DIAGNOSIS — F17210 Nicotine dependence, cigarettes, uncomplicated: Secondary | ICD-10-CM

## 2018-05-25 DIAGNOSIS — J301 Allergic rhinitis due to pollen: Secondary | ICD-10-CM | POA: Diagnosis not present

## 2018-05-25 DIAGNOSIS — R0982 Postnasal drip: Secondary | ICD-10-CM

## 2018-05-25 DIAGNOSIS — R635 Abnormal weight gain: Secondary | ICD-10-CM | POA: Diagnosis not present

## 2018-05-25 DIAGNOSIS — L309 Dermatitis, unspecified: Secondary | ICD-10-CM

## 2018-05-25 DIAGNOSIS — Z6838 Body mass index (BMI) 38.0-38.9, adult: Secondary | ICD-10-CM

## 2018-05-25 DIAGNOSIS — E6609 Other obesity due to excess calories: Secondary | ICD-10-CM

## 2018-05-25 MED ORDER — DESONIDE 0.05 % EX CREA: TOPICAL_CREAM | Freq: Two times a day (BID) | CUTANEOUS | 0 refills | Status: DC

## 2018-05-25 MED ORDER — VARENICLINE TARTRATE 0.5 MG X 11 & 1 MG X 42 PO MISC: ORAL | 0 refills | Status: DC

## 2018-05-25 MED ORDER — TRIAMCINOLONE ACETONIDE 40 MG/ML IJ SUSP
60.0000 mg | Freq: Once | INTRAMUSCULAR | Status: AC
  Administered 2018-05-25: 60 mg via INTRAMUSCULAR

## 2018-05-25 NOTE — Patient Instructions (Signed)

## 2018-05-26 LAB — CMP14+EGFR
ALT: 13 IU/L (ref 0–32)
AST: 16 IU/L (ref 0–40)
Albumin/Globulin Ratio: 1.4 (ref 1.2–2.2)
Albumin: 4.2 g/dL (ref 3.8–4.9)
Alkaline Phosphatase: 77 IU/L (ref 39–117)
BILIRUBIN TOTAL: 0.2 mg/dL (ref 0.0–1.2)
BUN/Creatinine Ratio: 9 (ref 9–23)
BUN: 8 mg/dL (ref 6–24)
CHLORIDE: 102 mmol/L (ref 96–106)
CO2: 22 mmol/L (ref 20–29)
Calcium: 9.5 mg/dL (ref 8.7–10.2)
Creatinine, Ser: 0.92 mg/dL (ref 0.57–1.00)
GFR calc non Af Amer: 72 mL/min/{1.73_m2} (ref >59)
GFR, EST AFRICAN AMERICAN: 83 mL/min/{1.73_m2} (ref >59)
GLOBULIN, TOTAL: 3 g/dL (ref 1.5–4.5)
Glucose: 88 mg/dL (ref 65–99)
Potassium: 4.6 mmol/L (ref 3.5–5.2)
SODIUM: 141 mmol/L (ref 134–144)
Total Protein: 7.2 g/dL (ref 6.0–8.5)

## 2018-05-26 LAB — HEMOGLOBIN A1C
ESTIMATED AVERAGE GLUCOSE: 111 mg/dL
Hgb A1c MFr Bld: 5.5 % (ref 4.8–5.6)

## 2018-05-26 LAB — TSH: TSH: 0.535 u[IU]/mL (ref 0.450–4.500)

## 2018-05-29 ENCOUNTER — Telehealth: Payer: Self-pay

## 2018-05-29 ENCOUNTER — Encounter: Payer: Self-pay | Admitting: Internal Medicine

## 2018-05-29 NOTE — Telephone Encounter (Signed)
PA SENT TO PLAN FOR CHANTIX

## 2018-05-29 NOTE — Progress Notes (Signed)
Subjective:     Patient ID: Devyn Griffing , female    DOB: [birth year] , 53 y.o.   MRN: [id number]   Chief Complaint  Patient presents with  . Chantix prescription  . Allergies    HPI  She is here today to discuss smoking cessation. She reports she is ready to quit. She wants to start Chantix. She is now smoking up to 1/4 ppd.   Additionally, she wants to return to Allergist for allergy shots. She reports her sx are worsening. She has runny nose, persistent sneezing and dry cough. She denies fever/chills.     Past Medical History:  Diagnosis Date  . Arthritis   . Chronic back pain   . Chronic neck pain   . Chronic shoulder pain    left  . Knee pain   . Migraine   . Sciatica      Family History  Problem Relation Age of Onset  . Cancer Mother   . Cancer Father      Current Outpatient Medications:  .  cetirizine (ZYRTEC) 10 MG tablet, Take 1 tablet (10 mg total) by mouth daily., Disp: 90 tablet, Rfl: 1 .  desonide (DESOWEN) 0.05 % cream, Apply topically 2 (two) times daily. Use as needed, Disp: 30 g, Rfl: 0 .  Linaclotide (LINZESS) 290 MCG CAPS capsule, Take 290 mcg by mouth daily., Disp: , Rfl:  .  metoCLOPramide (REGLAN) 10 MG tablet, , Disp: , Rfl:  .  ondansetron (ZOFRAN) 8 MG tablet, Take 4 mg by mouth every 8 (eight) hours as needed for nausea or vomiting., Disp: , Rfl:  .  predniSONE (STERAPRED UNI-PAK 21 TAB) 10 MG (21) TBPK tablet, Take as directed, Disp: 21 tablet, Rfl: 0 .  varenicline (CHANTIX STARTING MONTH PAK) 0.5 MG X 11 & 1 MG X 42 tablet, Take one 0.5 mg tablet by mouth once daily for 3 days, then increase to one 0.5 mg tablet twice daily for 4 days, then increase to one 1 mg tablet twice daily., Disp: 53 tablet, Rfl: 0 .  Vilazodone HCl (VIIBRYD) 40 MG TABS, Take 40 mg by mouth daily., Disp: , Rfl:    Allergies  Allergen Reactions  . Codeine Nausea And Vomiting     Review of Systems  Constitutional: Negative.   HENT: Positive for postnasal  drip and rhinorrhea.   Respiratory: Negative.   Cardiovascular: Negative.   Gastrointestinal: Negative.   Skin: Positive for rash (c/o rash on face. itchy, scaly. cleansers burn).  Neurological: Negative.   Psychiatric/Behavioral: Negative.      Today's Vitals   05/25/18 1441  BP: 122/84  Pulse: 83  Temp: 98.4 F (36.9 C)  TempSrc: Oral  SpO2: 98%  Weight: 231 lb 6.4 oz (105 kg)  Height: '5\' 5"'  (1.651 m)   Body mass index is 38.51 kg/m.   Objective:  Physical Exam Vitals signs and nursing note reviewed.  Constitutional:      Appearance: Normal appearance.  HENT:     Head: Normocephalic and atraumatic.     Right Ear: Tympanic membrane, ear canal and external ear normal.     Left Ear: Tympanic membrane, ear canal and external ear normal.     Mouth/Throat:     Mouth: Mucous membranes are moist.     Pharynx: Oropharynx is clear.  Cardiovascular:     Rate and Rhythm: Normal rate and regular rhythm.     Heart sounds: Normal heart sounds.  Pulmonary:     Effort: Pulmonary  effort is normal.     Breath sounds: Normal breath sounds.  Skin:    General: Skin is warm.     Findings: Rash present.     Comments: She has flesh-colored papular lesions in chin area. There are some areas of erythema. There is some dryness as well.   Neurological:     General: No focal deficit present.     Mental Status: She is alert.  Psychiatric:        Mood and Affect: Mood normal.        Behavior: Behavior normal.         Assessment And Plan:     1. Cigarette nicotine dependence without complication  She agrees to start Chantix. She was instructed on possible side effects. She was also advised on dosing schedule. She will rto in 4 weeks for re-evaluation. She is also encouraged to pick a quit date within the next four weeks.   2. Seasonal allergic rhinitis due to pollen  She wishes to resume allergy immunotherapy. I will refer her as requested.  - Ambulatory referral to Allergy  3.  Postnasal drip  She is encouraged to try levocetirizine IN PLACE of the cetirizine. She is advised to take this nightly since it can cause drowsiness.   4. Weight gain  She is encouraged to incorporate more exercise into her daily routine. She is encouraged to start with 10 minutes five days weekly, and build up to at least 30 minutes five days weekly. She is also encouraged to avoid processed foods and sugary beverages.   - CMP14+EGFR - Hemoglobin A1c - TSH  5. Dermatitis  She was also given rx desowen cream to affected area twice daily as needed.  She is encouraged to let me know if her symptoms improve/worsen.   - Ambulatory referral to Dermatology - triamcinolone acetonide (KENALOG-40) injection 60 mg  6. Class 2 obesity due to excess calories without serious comorbidity with body mass index (BMI) of 38.0 to 38.9 in adult  Importance of achieving optimal weight to decrease risk of cardiovascular disease and cancers was discussed with the patient in full detail. She is encouraged to start slowly - start with 10 minutes twice daily at least three to four days per week and to gradually build to 30 minutes five days weekly. She was given tips to incorporate more activity into her daily routine - take stairs when possible, park farther away from her job, grocery stores, etc. .     Maximino Greenland, MD

## 2018-05-31 ENCOUNTER — Other Ambulatory Visit: Payer: Self-pay | Admitting: Gastroenterology

## 2018-05-31 DIAGNOSIS — R110 Nausea: Secondary | ICD-10-CM

## 2018-06-19 ENCOUNTER — Telehealth: Payer: Self-pay | Admitting: Internal Medicine

## 2018-06-19 NOTE — Telephone Encounter (Signed)
Patient has agreed to do a Virtual appointment with Dr.Sanders 06/22/18

## 2018-06-22 ENCOUNTER — Other Ambulatory Visit: Payer: Self-pay

## 2018-06-22 ENCOUNTER — Encounter: Payer: Self-pay | Admitting: Internal Medicine

## 2018-06-22 ENCOUNTER — Ambulatory Visit (INDEPENDENT_AMBULATORY_CARE_PROVIDER_SITE_OTHER): Payer: Managed Care, Other (non HMO) | Admitting: Internal Medicine

## 2018-06-22 ENCOUNTER — Ambulatory Visit: Payer: Managed Care, Other (non HMO) | Admitting: Allergy

## 2018-06-22 VITALS — BP 134/89 | HR 81 | Ht 65.0 in | Wt 220.0 lb

## 2018-06-22 DIAGNOSIS — Z6836 Body mass index (BMI) 36.0-36.9, adult: Secondary | ICD-10-CM

## 2018-06-22 DIAGNOSIS — E6609 Other obesity due to excess calories: Secondary | ICD-10-CM | POA: Diagnosis not present

## 2018-06-22 DIAGNOSIS — H1013 Acute atopic conjunctivitis, bilateral: Secondary | ICD-10-CM | POA: Diagnosis not present

## 2018-06-22 DIAGNOSIS — J301 Allergic rhinitis due to pollen: Secondary | ICD-10-CM | POA: Diagnosis not present

## 2018-06-22 DIAGNOSIS — Z7189 Other specified counseling: Secondary | ICD-10-CM

## 2018-06-22 DIAGNOSIS — F17210 Nicotine dependence, cigarettes, uncomplicated: Secondary | ICD-10-CM | POA: Diagnosis not present

## 2018-06-22 MED ORDER — LEVOCETIRIZINE DIHYDROCHLORIDE 5 MG PO TABS: 5.0000 mg | ORAL_TABLET | Freq: Every evening | ORAL | 5 refills | Status: DC

## 2018-06-22 MED ORDER — OLOPATADINE HCL 0.1 % OP SOLN: 1.0000 [drp] | Freq: Two times a day (BID) | OPHTHALMIC | 1 refills | Status: DC

## 2018-06-22 NOTE — Patient Instructions (Signed)

## 2018-06-24 NOTE — Progress Notes (Addendum)
Virtual Visit via Video Note    This visit type was conducted due to national recommendations for restrictions regarding the COVID-19 Pandemic (e.g. social distancing).  This format is felt to be most appropriate for this patient at this time.  All issues noted in this document were discussed and addressed.  No physical exam was performed (except for noted visual exam findings with Video Visits).  Please refer to the patient's chart (MyChart message for video visits and phone note for telephone visits) for the patient's consent to telehealth for [facility].  Date:  06/24/2018   ID:  Kayla [name], DOB 1965/08/08, MRN [id number]  Patient Location:  In her car, she was in parking lot.   Provider location:   Office    Chief Complaint:  Chantix f/u  History of Present Illness:    Kayla [name] is a 53 y.o. female who presents via video conferencing for a telehealth visit today.   The patient does not have symptoms concerning for COVID-19 infection (fever, chills, cough, or new shortness of breath).   She has requested virtual visit today for f/u smoking cessation.  She has taken Chantix without any issues. She has not experienced any side effects. She is not smoking more than 2 cigs per day.     Past Medical History:  Diagnosis Date  . Arthritis   . Chronic back pain   . Chronic neck pain   . Chronic shoulder pain    left  . Knee pain   . Migraine   . Sciatica    Past Surgical History:  Procedure Laterality Date  . CESAREAN SECTION    . FOOT SURGERY Left 2018  . LESION EXCISION Right    X3  . SHOULDER SURGERY Left 2015  . TUBAL LIGATION       Current Meds  Medication Sig  . cetirizine (ZYRTEC) 10 MG tablet Take 1 tablet (10 mg total) by mouth daily.  Marland Kitchen desonide (DESOWEN) 0.05 % cream Apply topically 2 (two) times daily. Use as needed  . Linaclotide (LINZESS) 290 MCG CAPS capsule Take 290 mcg by mouth daily.  . metoCLOPramide (REGLAN) 10 MG tablet 3 (three)  times daily before meals.   . ondansetron (ZOFRAN) 8 MG tablet Take 4 mg by mouth every 8 (eight) hours as needed for nausea or vomiting.     Allergies:   Codeine   Social History   Tobacco Use  . Smoking status: Current Every Day Smoker    Packs/day: 0.25    Years: 34.00    Pack years: 8.50    Types: Cigarettes  . Smokeless tobacco: Never Used  Substance Use Topics  . Alcohol use: Yes    Alcohol/week: 1.0 standard drinks    Types: 1 Glasses of wine per week    Comment: occasionally  . Drug use: No    Types: Marijuana    Comment: former user     Family Hx: The patient's family history includes Cancer in her father and mother.  ROS:   Please see the history of present illness.    Review of Systems  Constitutional: Negative.   HENT: Positive for congestion.        She c/o itchy eyes. Sinus drainage/congestion and runny nose  Respiratory: Negative.   Cardiovascular: Negative.   Gastrointestinal: Negative.   Neurological: Negative.   Psychiatric/Behavioral: Negative.     All other systems reviewed and are negative.   Labs/Other Tests and Data Reviewed:    Recent Labs:  05/25/2018: ALT 13; BUN 8; Creatinine, Ser 0.92; Potassium 4.6; Sodium 141; TSH 0.535   Recent Lipid Panel No results found for: CHOL, TRIG, HDL, CHOLHDL, LDLCALC, LDLDIRECT  Wt Readings from Last 3 Encounters:  06/22/18 220 lb (99.8 kg)  05/25/18 231 lb 6.4 oz (105 kg)  01/27/18 224 lb 3.2 oz (101.7 kg)     Exam:    Vital Signs:  BP 134/89 (BP Location: Left Arm, Patient Position: Sitting, Cuff Size: Normal) Comment: pt provided  Pulse 81   Ht 5\' 5"  (1.651 m)   Wt 220 lb (99.8 kg) Comment: pt provided  LMP  (LMP Unknown)   BMI 36.61 kg/m     Physical Exam  Constitutional: She is well-developed, well-nourished, and in no distress.  HENT:  Head: Normocephalic and atraumatic.  Eyes: Pupils are equal, round, and reactive to light. EOM are normal.  Pulmonary/Chest: Effort normal.   Neurological: She is alert.  Psychiatric: Affect normal.  Nursing note and vitals reviewed.   ASSESSMENT & PLAN:     1. Cigarette nicotine dependence without complication  She will continue with Chantix. She is encouraged to pick a quit date within the next 14-21 days. She will rto in 3 months for re-evaluation. She was also congratulated on her success at decreasing number of cigs smoked per day.   2. Seasonal allergic rhinitis due to pollen  Chronic. She was given refill of levocetirizine.   3. Allergic conjunctivitis of both eyes  She was given rx patanol gtt. She will let me know if her sx persist.   4. Class 2 obesity due to excess calories without serious comorbidity with body mass index (BMI) of 36.0 to 36.9 in adult  She was congratulated on her 11 pound weight loss and encouraged to keep up the great work. Importance of achieving optimal weight to decrease risk of cardiovascular disease and cancers was discussed with the patient in full detail. She is encouraged to start slowly - start with 10 minutes twice daily at least three to four days per week and to gradually build to 30 minutes five days weekly. She was given tips to incorporate more activity into her daily routine - take stairs when possible, park farther away from her job, grocery stores, etc.      COVID-19 Education: The signs and symptoms of COVID-19 were discussed with the patient and how to seek care for testing (follow up with PCP or arrange E-visit).  The importance of social distancing was discussed today.  Patient Risk:   After full review of this patients clinical status, I feel that they are at least moderate risk at this time.  Time:   Today, I have spent 15 minutes 16 seconds with the patient with telehealth technology discussing above diagnoses     Medication Adjustments/Labs and Tests Ordered: Current medicines are reviewed at length with the patient today.  Concerns regarding medicines are  outlined above.  Tests Ordered: No orders of the defined types were placed in this encounter.  Medication Changes: Meds ordered this encounter  Medications  . levocetirizine (XYZAL) 5 MG tablet    Sig: Take 1 tablet (5 mg total) by mouth every evening.    Dispense:  30 tablet    Refill:  5  . olopatadine (PATANOL) 0.1 % ophthalmic solution    Sig: Place 1 drop into both eyes 2 (two) times daily.    Dispense:  5 mL    Refill:  1    Disposition:  Follow  up 8 weeks.  Signed, Maximino Greenland, MD

## 2018-06-29 ENCOUNTER — Encounter: Payer: Self-pay | Admitting: Allergy & Immunology

## 2018-06-29 ENCOUNTER — Ambulatory Visit: Payer: Managed Care, Other (non HMO) | Admitting: Allergy & Immunology

## 2018-06-29 ENCOUNTER — Other Ambulatory Visit: Payer: Self-pay

## 2018-06-29 VITALS — BP 122/80 | HR 94 | Temp 97.9°F | Resp 16 | Ht 65.0 in | Wt 229.4 lb

## 2018-06-29 DIAGNOSIS — J3089 Other allergic rhinitis: Secondary | ICD-10-CM | POA: Diagnosis not present

## 2018-06-29 DIAGNOSIS — J302 Other seasonal allergic rhinitis: Secondary | ICD-10-CM

## 2018-06-29 DIAGNOSIS — T781XXD Other adverse food reactions, not elsewhere classified, subsequent encounter: Secondary | ICD-10-CM

## 2018-06-29 DIAGNOSIS — T781XXA Other adverse food reactions, not elsewhere classified, initial encounter: Secondary | ICD-10-CM | POA: Insufficient documentation

## 2018-06-29 DIAGNOSIS — T7819XD Other adverse food reactions, not elsewhere classified, subsequent encounter: Secondary | ICD-10-CM

## 2018-06-29 MED ORDER — EPINEPHRINE 0.3 MG/0.3ML IJ SOAJ: 0.3000 mg | Freq: Once | INTRAMUSCULAR | 2 refills | Status: AC

## 2018-06-29 MED ORDER — TRIAMCINOLONE ACETONIDE 55 MCG/ACT NA AERO: 2.0000 | INHALATION_SPRAY | Freq: Every day | NASAL | 5 refills | Status: DC

## 2018-06-29 MED ORDER — AZELASTINE HCL 0.1 % NA SOLN: 2.0000 | Freq: Two times a day (BID) | NASAL | 5 refills | Status: DC

## 2018-06-29 MED ORDER — MONTELUKAST SODIUM 10 MG PO TABS: 10.0000 mg | ORAL_TABLET | Freq: Every day | ORAL | 5 refills | Status: DC

## 2018-06-29 MED ORDER — CETIRIZINE HCL 10 MG PO TABS: 10.0000 mg | ORAL_TABLET | Freq: Every day | ORAL | 5 refills | Status: DC

## 2018-06-29 NOTE — Progress Notes (Signed)
NEW PATIENT  Date of Service/Encounter:  06/29/18  Referring provider: Glendale Chard, MD   Assessment:   Seasonal and perennial allergic rhinitis (grasses, ragweed, weeds, trees, indoor molds, outdoor molds, dust mites, cat and dog)  Adverse food reactions (tree nuts, wheat, grape) - with negative testing today (confirming with blood work)  Plan/Recommendations:   1. Seasonal and perennial allergic rhinitis - Testing today showed: grasses, ragweed, weeds, trees, indoor molds, outdoor molds, dust mites, cat and dog - Copy of test results provided.  - Avoidance measures provided. - Continue with: Zyrtec (cetirizine) 10mg  tablet once daily - Start taking: Singulair (montelukast) 10mg  daily, Nasacort (triamcinolone) two sprays per nostril daily and Astelin (azelastine) 2 sprays per nostril 1-2 times daily as needed - You can use an extra dose of the antihistamine, if needed, for breakthrough symptoms.  - Consider nasal saline rinses 1-2 times daily to remove allergens from the nasal cavities as well as help with mucous clearance (this is especially helpful to do before the nasal sprays are given) - Consider allergy shots as a means of long-term control. - Call us when you make a decision and we can mix up the vials.  2. Adverse food reaction - Testing was negative to everything tested today. - We will get some blood work to confirm the negative testing to tree nuts as well as wheat and grapes since you have reactions to these. [facility] Luna script provided today. - Anaphylaxis management plan provided today.  3. Return in about 3 months (around 09/28/2018). This can be an in-person, a virtual Webex or a telephone follow up visit.  Subjective:   Kayla [name] is a 53 y.o. female presenting today for evaluation of  Chief Complaint  Patient presents with  . Allergic Rhinitis     itching, swelling in the eyes,   . Cough    post nasal drip    Kayla [name] has a history of  the following: Patient Active Problem List   Diagnosis Date Noted  . Adverse food reaction 06/29/2018  . Seasonal and perennial allergic rhinitis 06/29/2018  . Excessive daytime sleepiness 04/28/2017  . 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 Sleep-related headache 04/28/2017  . GERD with apnea 04/28/2017    History obtained from: chart review and patient.  Kayla [name] was referred by Glendale Chard, MD.     Kayla [name] is a 53 y.o. female presenting for an evaluation of allergic rhinitis.  Allergic Rhinitis Symptom History: She has a longstanding history of allergies.  She was previously followed in our practice, last seen in 2016.  She endorses frequent crusty itchy eyes.  She would describe herself as a "itch box".  She does tell me that her nose and throat are often very itchy.  Although she has symptoms throughout the year, spring does seem to be worse.  She is on cetirizine daily, which does provide some relief.  No sprays do help.  She is on Nasacort currently.  She takes Xyzal when her postnasal drip is particularly bad.  She was on allergy shots from 2014 through 2016, however she was getting these at her primary care provider's office.  She did feel good when she was on them, but clearly they did not have lasting effects.  Her primary care provider has since stopped providing allergy services.  She will occasionally change between antihistamines since they seem to stop working after a period of time.  Food Allergy Symptom History: Evidently, as part of this  allergy work-up, she had food testing done.  She tells me she was tested to "everything".  She lists several different foods that she is allergic to, but it turns out she is eating a lot of these without any problems.  She does have some itching associated with ingestion of almonds.  She also endorses a similar experience with grapes.  With brown rice and wheat bread as well as whole-wheat, she has nausea.  She eats peanuts, eggs,  and seafood without any problems.  Otherwise, there is no history of other atopic diseases, including asthma, drug allergies, stinging insect allergies, eczema, urticaria or contact dermatitis. There is no significant infectious history. Vaccinations are up to date.    Past Medical History: Patient Active Problem List   Diagnosis Date Noted  . Adverse food reaction 06/29/2018  . Seasonal and perennial allergic rhinitis 06/29/2018  . Excessive daytime sleepiness 04/28/2017  . 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 Sleep-related headache 04/28/2017  . GERD with apnea 04/28/2017    Medication List:  Allergies as of 06/29/2018      Reactions   Codeine Nausea And Vomiting      Medication List       Accurate as of June 29, 2018  1:22 PM. Always use your most recent med list.        azelastine 0.1 % nasal spray Commonly known as:  ASTELIN Place 2 sprays into both nostrils 2 (two) times daily.   cetirizine 10 MG tablet Commonly known as:  ZYRTEC Take 1 tablet (10 mg total) by mouth daily.   cetirizine 10 MG tablet Commonly known as:  ZYRTEC Take 1 tablet (10 mg total) by mouth daily.   desonide 0.05 % cream Commonly known as:  DesOwen Apply topically 2 (two) times daily. Use as needed   EPINEPHrine 0.3 mg/0.3 mL Soaj injection Commonly known as:  Auvi-Q Inject 0.3 mLs (0.3 mg total) into the muscle once for 1 dose. As directed for life-threatening allergic reactions   levocetirizine 5 MG tablet Commonly known as:  Xyzal Take 1 tablet (5 mg total) by mouth every evening.   Linzess 290 MCG Caps capsule Generic drug:  linaclotide Take 290 mcg by mouth daily.   metoCLOPramide 10 MG tablet Commonly known as:  REGLAN 3 (three) times daily before meals.   montelukast 10 MG tablet Commonly known as:  SINGULAIR Take 1 tablet (10 mg total) by mouth at bedtime.   olopatadine 0.1 % ophthalmic solution Commonly known as:  PATANOL Place 1 drop into both eyes 2 (two)  times daily.   ondansetron 8 MG tablet Commonly known as:  ZOFRAN Take 4 mg by mouth every 8 (eight) hours as needed for nausea or vomiting.   triamcinolone 55 MCG/ACT Aero nasal inhaler Commonly known as:  NASACORT Place 2 sprays into the nose daily.   varenicline 0.5 MG X 11 & 1 MG X 42 tablet Commonly known as:  Chantix Starting Month Pak Take one 0.5 mg tablet by mouth once daily for 3 days, then increase to one 0.5 mg tablet twice daily for 4 days, then increase to one 1 mg tablet twice daily.       Birth History: non-contributory  Developmental History: non-contributory  Past Surgical History: Past Surgical History:  Procedure Laterality Date  . CESAREAN SECTION    . FOOT SURGERY Left 2018  . LESION EXCISION Right    X3  . SHOULDER SURGERY Left 2015  . TUBAL LIGATION  Family History: Family History  Problem Relation Age of Onset  . Cancer Mother   . Cancer Father      Social History: Kayla [name] lives at home with her family.  They live in an apartment with carpeting throughout the home.  There is no mildew or roaches in the home.  They have dogs and rabbits in the home.  They have electric heating and central cooling with some window units as well.  She does have dust mite covers on her bedding.  There is tobacco exposure in the car.  She has smoked on and off for years but is now down to 4 cigarettes/day.  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    Review of Systems  Constitutional: Negative.  Negative for chills, fever, malaise/fatigue and weight loss.  HENT: Positive for sinus pain and sore throat. Negative for congestion, ear discharge and ear pain.        Positive for postnasal drip.  Eyes: Negative for pain, discharge and redness.  Respiratory: Positive for cough. Negative for sputum production, shortness of breath and wheezing.   Cardiovascular: Negative.  Negative for chest pain and  palpitations.  Gastrointestinal: Positive for nausea. Negative for abdominal pain, heartburn and vomiting.  Skin: Negative.  Negative for itching and rash.  Neurological: Negative for dizziness and headaches.  Endo/Heme/Allergies: Positive for environmental allergies. Does not bruise/bleed easily.       Objective:   Blood pressure 122/80, pulse 94, temperature 97.9 F (36.6 C), temperature source Oral, resp. rate 16, height 5\' 5"  (1.651 m), weight 229 lb 6.4 oz (104.1 kg), SpO2 99 %. Body mass index is 38.17 kg/m.   Physical Exam:   Physical Exam  Constitutional: She appears well-developed.  Pleasant female.  HENT:  Head: Normocephalic and atraumatic.  Right Ear: Tympanic membrane, external ear and ear canal normal. No drainage, swelling or tenderness. Tympanic membrane is not injected, not scarred, not erythematous, not retracted and not bulging.  Left Ear: Tympanic membrane, external ear and ear canal normal. No drainage, swelling or tenderness. Tympanic membrane is not injected, not scarred, not erythematous, not retracted and not bulging.  Nose: Mucosal edema and rhinorrhea present. No nasal deformity or septal deviation. No epistaxis. Right sinus exhibits no maxillary sinus tenderness and no frontal sinus tenderness. Left sinus exhibits no maxillary sinus tenderness and no frontal sinus tenderness.  Mouth/Throat: Uvula is midline and oropharynx is clear and moist. Mucous membranes are not pale and not dry.  Turbinates are markedly enlarged bilaterally.  It is difficult to see past the turbinates on the right side.  There is no epistaxis.  There is cobblestoning present in the posterior oropharynx.  Tonsils 2+ bilaterally without exudates.  Eyes: Pupils are equal, round, and reactive to light. Conjunctivae and EOM are normal. Right eye exhibits no chemosis and no discharge. Left eye exhibits no chemosis and no discharge. Right conjunctiva is not injected. Left conjunctiva is not  injected.  Cardiovascular: Normal rate, regular rhythm and normal heart sounds.  Respiratory: Effort normal and breath sounds normal. No accessory muscle usage. No tachypnea. No respiratory distress. She has no wheezes. She has no rhonchi. She has no rales. She exhibits no tenderness.  Moving air well in all lung fields.  GI: There is no abdominal tenderness. There is no rebound and no guarding.  Lymphadenopathy:       Head (right side): No submandibular, no tonsillar and no occipital adenopathy present.  Head (left side): No submandibular, no tonsillar and no occipital adenopathy present.    She has no cervical adenopathy.  Neurological: She is alert.  Skin: No abrasion, no petechiae and no rash noted. Rash is not papular, not vesicular and not urticarial. No erythema. No pallor.  No eczematous or urticarial lesions noted.  Psychiatric: She has a normal mood and affect.     Diagnostic studies:   Allergy Studies:    Airborne Adult Perc - 06/29/18 0923    Time Antigen Placed  0915    Allergen Manufacturer  Lavella Hammock    Location  Back    Number of Test  59    Panel 1  Select    1. Control-Buffer 50% Glycerol  Negative    2. Control-Histamine 1 mg/ml  2+    3. Albumin saline  Negative    4. [place]  4+    5. [place]  4+    6. [place]  4+    7. [place] Blue  Negative    8. Meadow Fescue  Negative    9. Perennial Rye  4+    10. Sweet Vernal  3+    11. Timothy  4+    12. Cocklebur  Negative    13. Burweed [place]er  Negative    14. Ragweed, short  Negative    15. Ragweed, Giant  Negative    16. Plantain,  English  2+    17. Lamb's Quarters  Negative    18. Sheep Sorrell  Negative    19. Rough Pigweed  Negative    20. Marsh Elder, Rough  Negative    21. Mugwort, Common  2+    22. Ash mix  Negative    23. Birch mix  3+    24. Beech American  Negative    25. Box, Elder  Negative    26. Cedar, red  3+    27. Cottonwood, [place]  Negative    28. Elm mix  Negative    29.  Hickory mix  Negative    30. Maple mix  Negative    31. Oak, [place] mix  2+    32. Pecan Pollen  Negative    33. Pine mix  Negative    34. Sycamore Eastern  Negative    35. [place], Black Pollen  Negative    36. Alternaria alternata  Negative    37. Cladosporium Herbarum  Negative    38. Aspergillus mix  Negative    39. Penicillium mix  Negative    40. Bipolaris sorokiniana (Helminthosporium)  Negative    41. Drechslera spicifera (Curvularia)  Negative    42. Mucor plumbeus  Negative    43. Fusarium moniliforme  Negative    44. Aureobasidium pullulans (pullulara)  Negative    45. Rhizopus oryzae  Negative    46. Botrytis cinera  Negative    47. Epicoccum nigrum  Negative    48. Phoma betae  Negative    49. Candida Albicans  Negative    50. Trichophyton mentagrophytes  Negative    51. Mite, D Farinae  5,000 AU/ml  Negative    52. Mite, D Pteronyssinus  5,000 AU/ml  Negative    53. Cat Hair 10,000 BAU/ml  Negative    54.  Dog Epithelia  3+    55. Mixed Feathers  Negative    56. Horse Epithelia  Negative    57. Cockroach, German  Negative    58. Mouse  Negative  59. Tobacco Leaf  Negative     Intradermal - 06/29/18 0950    Time Antigen Placed  0945    Allergen Manufacturer  Lavella Hammock    Location  Arm    Number of Test  9    Intradermal  Select    Control  Negative    Ragweed mix  4+    Mold 1  2+    Mold 2  3+    Mold 3  2+    Mold 4  3+    Cat  3+    Cockroach  Negative    Mite mix  1+     Food Adult Perc - 06/29/18 0900    Time Antigen Placed  0915    Allergen Manufacturer  Lavella Hammock    Location  Back    Number of allergen test  19    Panel 2  Select    Control-Histamine 1 mg/ml  2+    1. Peanut  Negative    2. Soybean  Negative    3. Wheat  Negative    4. Sesame  Negative    5. Milk, cow  Negative    6. Egg White, Chicken  Negative    7. Casein  Negative    8. Shellfish Mix  Negative    9. Fish Mix  Negative    10. Cashew  Negative    11. Pecan Food   Negative    12. Lydia  Negative    13. Almond  Negative    14. Hazelnut  Negative    15. [place] nut  Negative    16. Coconut  Negative    17. Pistachio  Negative    55. Grape (White seedless)  Negative    3. Rabbit  Negative       Allergy testing results were read and interpreted by myself, documented by clinical staff.         Salvatore Marvel, MD [facility]

## 2018-06-29 NOTE — Patient Instructions (Addendum)
1. Seasonal and perennial allergic rhinitis - Testing today showed: grasses, ragweed, weeds, trees, indoor molds, outdoor molds, dust mites, cat and dog - Copy of test results provided.  - Avoidance measures provided. - Continue with: Zyrtec (cetirizine) 10mg  tablet once daily - Start taking: Singulair (montelukast) 10mg  daily, Nasacort (triamcinolone) two sprays per nostril daily and Astelin (azelastine) 2 sprays per nostril 1-2 times daily as needed - You can use an extra dose of the antihistamine, if needed, for breakthrough symptoms.  - Consider nasal saline rinses 1-2 times daily to remove allergens from the nasal cavities as well as help with mucous clearance (this is especially helpful to do before the nasal sprays are given) - Consider allergy shots as a means of long-term control. - Call us when you make a decision and we can mix up the vials.  2. Adverse food reaction, subsequent encounter - Testing was negative to everything tested today. - We will get some blood work to confirm the negative testing to tree nuts as well as wheat and grapes since you have reactions to these. [facility] script provided today. - Anaphylaxis management plan provided today.  3. Return in about 3 months (around 09/28/2018). This can be an in-person, a virtual Webex or a telephone follow up visit.   Please inform us of any Emergency Department visits, hospitalizations, or changes in symptoms. Call us before going to the ED for breathing or allergy symptoms since we might be able to fit you in for a sick visit. Feel free to contact us anytime with any questions, problems, or concerns.  It was a pleasure to meet you today!  Websites that have reliable patient information: 1. American Academy of Asthma, Allergy, and Immunology: www.aaaai.org 2. Food Allergy Research and Education (FARE): foodallergy.org 3. Mothers of Asthmatics: http://www.asthmacommunitynetwork.org 4. American College of Allergy, Asthma,  and Immunology: www.acaai.org  "Like" [place] on Facebook and Instagram for our latest updates!      Make sure you are registered to vote! If you have moved or changed any of your contact information, you will need to get this updated before voting!    Voter ID laws are NOT going into effect for the General Election in November 2020! DO NOT let this stop you from exercising your right to vote!     Reducing Pollen Exposure  The American Academy of Allergy, Asthma and Immunology suggests the following steps to reduce your exposure to pollen during allergy seasons.    1. Do not hang sheets or clothing out to dry; pollen may collect on these items. 2. Do not mow lawns or spend time around freshly cut grass; mowing stirs up pollen. 3. Keep windows closed at night.  Keep car windows closed while driving. 4. Minimize morning activities outdoors, a time when pollen counts are usually at their highest. 5. Stay indoors as much as possible when pollen counts or humidity is high and on windy days when pollen tends to remain in the air longer. 6. Use air conditioning when possible.  Many air conditioners have filters that trap the pollen spores. 7. Use a HEPA room air filter to remove pollen form the indoor air you breathe.  Control of Mold Allergen   Mold and fungi can grow on a variety of surfaces provided certain temperature and moisture conditions exist.  Outdoor molds grow on plants, decaying vegetation and soil.  The major outdoor mold, Alternaria and Cladosporium, are found in very high numbers during hot and dry conditions.  Generally,  a late Summer - Fall peak is seen for common outdoor fungal spores.  Rain will temporarily lower outdoor mold spore count, but counts rise rapidly when the rainy period ends.  The most important indoor molds are Aspergillus and Penicillium.  Dark, humid and poorly ventilated basements are ideal sites for mold growth.  The next most common sites of mold growth are the  bathroom and the kitchen.  Outdoor (Seasonal) Mold Control   1. Use air conditioning and keep windows closed 2. Avoid exposure to decaying vegetation. 3. Avoid leaf raking. 4. Avoid grain handling. 5. Consider wearing a face mask if working in moldy areas.  6.   Indoor (Perennial) Mold Control    1. Maintain humidity below 50%. 2. Clean washable surfaces with 5% bleach solution. 3. Remove sources e.g. contaminated carpets.    Control of House Dust Mite Allergen    House dust mites play a major role in allergic asthma and rhinitis.  They occur in environments with high humidity wherever human skin, the food for dust mites is found. High levels have been detected in dust obtained from mattresses, pillows, carpets, upholstered furniture, bed covers, clothes and soft toys.  The principal allergen of the house dust mite is found in its feces.  A gram of dust may contain 1,000 mites and 250,000 fecal particles.  Mite antigen is easily measured in the air during house cleaning activities.    1. Encase mattresses, including the box spring, and pillow, in an air tight cover.  Seal the zipper end of the encased mattresses with wide adhesive tape. 2. Wash the bedding in water of 130 degrees Farenheit weekly.  Avoid cotton comforters/quilts and flannel bedding: the most ideal bed covering is the dacron comforter. 3. Remove all upholstered furniture from the bedroom. 4. Remove carpets, carpet padding, rugs, and non-washable window drapes from the bedroom.  Wash drapes weekly or use plastic window coverings. 5. Remove all non-washable stuffed toys from the bedroom.  Wash stuffed toys weekly. 6. Have the room cleaned frequently with a vacuum cleaner and a damp dust-mop.  The patient should not be in a room which is being cleaned and should wait 1 hour after cleaning before going into the room. 7. Close and seal all heating outlets in the bedroom.  Otherwise, the room will become filled with  dust-laden air.  An electric heater can be used to heat the room. 8. Reduce indoor humidity to less than 50%.  Do not use a humidifier.  Control of Dog or Cat Allergen  Avoidance is the best way to manage a dog or cat allergy. If you have a dog or cat and are allergic to dog or cats, consider removing the dog or cat from the home. If you have a dog or cat but don't want to find it a new home, or if your family wants a pet even though someone in the household is allergic, here are some strategies that may help keep symptoms at bay:  1. Keep the pet out of your bedroom and restrict it to only a few rooms. Be advised that keeping the dog or cat in only one room will not limit the allergens to that room. 2. Don't pet, hug or kiss the dog or cat; if you do, wash your hands with soap and water. 3. High-efficiency particulate air (HEPA) cleaners run continuously in a bedroom or living room can reduce allergen levels over time. 4. Regular use of a high-efficiency vacuum cleaner or a  central vacuum can reduce allergen levels. 5. Giving your dog or cat a bath at least once a week can reduce airborne allergen.  Allergy Shots   Allergies are the result of a chain reaction that starts in the immune system. Your immune system controls how your body defends itself. For instance, if you have an allergy to pollen, your immune system identifies pollen as an invader or allergen. Your immune system overreacts by producing antibodies called Immunoglobulin E (IgE). These antibodies travel to cells that release chemicals, causing an allergic reaction.  The concept behind allergy immunotherapy, whether it is received in the form of shots or tablets, is that the immune system can be desensitized to specific allergens that trigger allergy symptoms. Although it requires time and patience, the payback can be long-term relief.  How Do Allergy Shots Work?  Allergy shots work much like a vaccine. Your body responds to  injected amounts of a particular allergen given in increasing doses, eventually developing a resistance and tolerance to it. Allergy shots can lead to decreased, minimal or no allergy symptoms.  There generally are two phases: build-up and maintenance. Build-up often ranges from three to six months and involves receiving injections with increasing amounts of the allergens. The shots are typically given once or twice a week, though more rapid build-up schedules are sometimes used.  The maintenance phase begins when the most effective dose is reached. This dose is different for each person, depending on how allergic you are and your response to the build-up injections. Once the maintenance dose is reached, there are longer periods between injections, typically two to four weeks.  Occasionally doctors give cortisone-type shots that can temporarily reduce allergy symptoms. These types of shots are different and should not be confused with allergy immunotherapy shots.  Who Can Be Treated with Allergy Shots?  Allergy shots may be a good treatment approach for people with allergic rhinitis (hay fever), allergic asthma, conjunctivitis (eye allergy) or stinging insect allergy.   Before deciding to begin allergy shots, you should consider:  . The length of allergy season and the severity of your symptoms . Whether medications and/or changes to your environment can control your symptoms . Your desire to avoid long-term medication use . Time: allergy immunotherapy requires a major time commitment . Cost: may vary depending on your insurance coverage  Allergy shots for children age 52 and older are effective and often well tolerated. They might prevent the onset of new allergen sensitivities or the progression to asthma.  Allergy shots are not started on patients who are pregnant but can be continued on patients who become pregnant while receiving them. In some patients with other medical conditions or  who take certain common medications, allergy shots may be of risk. It is important to mention other medications you talk to your allergist.   When Will I Feel Better?  Some may experience decreased allergy symptoms during the build-up phase. For others, it may take as long as 12 months on the maintenance dose. If there is no improvement after a year of maintenance, your allergist will discuss other treatment options with you.  If you aren't responding to allergy shots, it may be because there is not enough dose of the allergen in your vaccine or there are missing allergens that were not identified during your allergy testing. Other reasons could be that there are high levels of the allergen in your environment or major exposure to non-allergic triggers like tobacco smoke.  What Is the  Length of Treatment?  Once the maintenance dose is reached, allergy shots are generally continued for three to five years. The decision to stop should be discussed with your allergist at that time. Some people may experience a permanent reduction of allergy symptoms. Others may relapse and a longer course of allergy shots can be considered.  What Are the Possible Reactions?  The two types of adverse reactions that can occur with allergy shots are local and systemic. Common local reactions include very mild redness and swelling at the injection site, which can happen immediately or several hours after. A systemic reaction, which is less common, affects the entire body or a particular body system. They are usually mild and typically respond quickly to medications. Signs include increased allergy symptoms such as sneezing, a stuffy nose or hives.  Rarely, a serious systemic reaction called anaphylaxis can develop. Symptoms include swelling in the throat, wheezing, a feeling of tightness in the chest, nausea or dizziness. Most serious systemic reactions develop within 30 minutes of allergy shots. This is why it is strongly  recommended you wait in your doctor's office for 30 minutes after your injections. Your allergist is trained to watch for reactions, and his or her staff is trained and equipped with the proper medications to identify and treat them.  Who Should Administer Allergy Shots?  The preferred location for receiving shots is your prescribing allergist's office. Injections can sometimes be given at another facility where the physician and staff are trained to recognize and treat reactions, and have received instructions by your prescribing allergist.

## 2018-07-01 LAB — ALLERGEN, BRAZIL NUT, F18: Brazil Nut IgE: <0.1 kU/L

## 2018-07-01 LAB — ALLERGY PANEL 18, NUT MIX GROUP
Allergen Coconut IgE: <0.1 kU/L
F020-IgE Almond: 0.3 kU/L — AB
F202-IgE Cashew Nut: <0.1 kU/L
Hazelnut (Filbert) IgE: 0.37 kU/L — AB
Peanut IgE: 0.11 kU/L — AB
Pecan Nut IgE: <0.1 kU/L
Sesame Seed IgE: 0.11 kU/L — AB

## 2018-07-01 LAB — ALLERGEN WALNUT F256: Walnut IgE: <0.1 kU/L

## 2018-07-01 LAB — ALLERGEN GRAPE F259: Allergen Grape IgE: 0.16 kU/L — AB

## 2018-07-01 LAB — ALLERGEN, WHEAT, F4: Wheat IgE: <0.1 kU/L

## 2018-07-03 DIAGNOSIS — J301 Allergic rhinitis due to pollen: Secondary | ICD-10-CM | POA: Diagnosis not present

## 2018-07-03 NOTE — Progress Notes (Signed)
VIALS EXP 07-03-2019 

## 2018-07-04 DIAGNOSIS — J3089 Other allergic rhinitis: Secondary | ICD-10-CM

## 2018-07-11 ENCOUNTER — Encounter (HOSPITAL_COMMUNITY): Payer: Self-pay

## 2018-07-11 ENCOUNTER — Encounter (HOSPITAL_COMMUNITY): Payer: Managed Care, Other (non HMO)

## 2018-07-13 ENCOUNTER — Other Ambulatory Visit: Payer: Self-pay

## 2018-07-13 ENCOUNTER — Ambulatory Visit (INDEPENDENT_AMBULATORY_CARE_PROVIDER_SITE_OTHER): Payer: Managed Care, Other (non HMO)

## 2018-07-13 DIAGNOSIS — J309 Allergic rhinitis, unspecified: Secondary | ICD-10-CM | POA: Diagnosis not present

## 2018-07-13 NOTE — Progress Notes (Signed)
Immunotherapy   Patient Details  Name: Kayla [name] MRN: [id number] Date of Birth: [birth year]  07/13/2018  Kayla [name] Started her allergy injections. Patient received 0.05 out of both her blue vials. One vial with Grass-Weeds-Trees-Cat-Dog and the other with RW-Molds-DM. Patient waited 30 min in an exam room with no problems. Patient did have a pinpoint on her right arm. Following schedule:  B Frequency: 1-2 times weekly Epi-Pen: Yes Consent signed and patient instructions given.   Herbie Drape 07/13/2018, 10:07 AM

## 2018-07-20 ENCOUNTER — Ambulatory Visit (INDEPENDENT_AMBULATORY_CARE_PROVIDER_SITE_OTHER): Payer: Managed Care, Other (non HMO)

## 2018-07-20 DIAGNOSIS — J309 Allergic rhinitis, unspecified: Secondary | ICD-10-CM

## 2018-07-27 ENCOUNTER — Other Ambulatory Visit: Payer: Self-pay

## 2018-07-27 ENCOUNTER — Ambulatory Visit (HOSPITAL_COMMUNITY)
Admission: RE | Admit: 2018-07-27 | Discharge: 2018-07-27 | Disposition: A | Discharge status: Home / Self Care | Payer: Managed Care, Other (non HMO) | Source: Ambulatory Visit | Attending: Gastroenterology | Admitting: Gastroenterology

## 2018-07-27 DIAGNOSIS — R110 Nausea: Secondary | ICD-10-CM | POA: Diagnosis present

## 2018-07-27 MED ORDER — TECHNETIUM TC 99M SULFUR COLLOID
1.9000 | Freq: Once | INTRAVENOUS | Status: AC | PRN
  Administered 2018-07-27: 1.9 via INTRAVENOUS

## 2018-07-28 ENCOUNTER — Ambulatory Visit (INDEPENDENT_AMBULATORY_CARE_PROVIDER_SITE_OTHER): Payer: Managed Care, Other (non HMO) | Admitting: *Deleted

## 2018-07-28 DIAGNOSIS — J309 Allergic rhinitis, unspecified: Secondary | ICD-10-CM

## 2018-08-04 ENCOUNTER — Encounter: Payer: Self-pay | Admitting: Adult Health

## 2018-08-04 ENCOUNTER — Ambulatory Visit (INDEPENDENT_AMBULATORY_CARE_PROVIDER_SITE_OTHER): Payer: Managed Care, Other (non HMO) | Admitting: *Deleted

## 2018-08-04 DIAGNOSIS — J309 Allergic rhinitis, unspecified: Secondary | ICD-10-CM

## 2018-08-14 ENCOUNTER — Ambulatory Visit (INDEPENDENT_AMBULATORY_CARE_PROVIDER_SITE_OTHER): Payer: Managed Care, Other (non HMO)

## 2018-08-14 DIAGNOSIS — J309 Allergic rhinitis, unspecified: Secondary | ICD-10-CM

## 2018-08-17 ENCOUNTER — Ambulatory Visit (INDEPENDENT_AMBULATORY_CARE_PROVIDER_SITE_OTHER): Payer: Managed Care, Other (non HMO) | Admitting: Internal Medicine

## 2018-08-17 ENCOUNTER — Other Ambulatory Visit: Payer: Self-pay

## 2018-08-17 ENCOUNTER — Encounter: Payer: Self-pay | Admitting: Internal Medicine

## 2018-08-17 VITALS — BP 126/82 | HR 80 | Temp 98.3°F | Ht 65.0 in | Wt 226.6 lb

## 2018-08-17 DIAGNOSIS — F419 Anxiety disorder, unspecified: Secondary | ICD-10-CM

## 2018-08-17 DIAGNOSIS — Z79899 Other long term (current) drug therapy: Secondary | ICD-10-CM

## 2018-08-17 DIAGNOSIS — Z6837 Body mass index (BMI) 37.0-37.9, adult: Secondary | ICD-10-CM

## 2018-08-17 DIAGNOSIS — K3184 Gastroparesis: Secondary | ICD-10-CM | POA: Diagnosis not present

## 2018-08-17 DIAGNOSIS — R635 Abnormal weight gain: Secondary | ICD-10-CM

## 2018-08-17 DIAGNOSIS — L601 Onycholysis: Secondary | ICD-10-CM | POA: Diagnosis not present

## 2018-08-17 MED ORDER — VILAZODONE HCL 40 MG PO TABS: 40.0000 mg | ORAL_TABLET | Freq: Every day | ORAL | 1 refills | Status: DC

## 2018-08-17 NOTE — Progress Notes (Signed)
Subjective:     Patient ID: Kayla [name] , female    DOB: January 15, 1966 , 53 y.o.   MRN: [id number]   Chief Complaint  Patient presents with  . Other    anxiety    HPI  She is here today to discuss anxiety. She reports that she has been more anxious than usual. She admits that she needs to resume Viibryd. She reports she stopped the medication awhile ago because she was feeling fine. She reports work stressors and stressors associated with COVID-19 pandemic have caused her anxiety to worsen. She feels the Viibryd will help her get back on track.     Past Medical History:  Diagnosis Date  . Arthritis   . Chronic back pain   . Chronic neck pain   . Chronic shoulder pain    left  . Knee pain   . Migraine   . Sciatica      Family History  Problem Relation Age of Onset  . Cancer Mother   . Cancer Father      Current Outpatient Medications:  .  azelastine (ASTELIN) 0.1 % nasal spray, Place 2 sprays into both nostrils 2 (two) times daily., Disp: 30 mL, Rfl: 5 .  cetirizine (ZYRTEC) 10 MG tablet, Take 1 tablet (10 mg total) by mouth daily., Disp: 30 tablet, Rfl: 5 .  desonide (DESOWEN) 0.05 % cream, Apply topically 2 (two) times daily. Use as needed, Disp: 30 g, Rfl: 0 .  levocetirizine (XYZAL) 5 MG tablet, Take 1 tablet (5 mg total) by mouth every evening., Disp: 30 tablet, Rfl: 5 .  Linaclotide (LINZESS) 290 MCG CAPS capsule, Take 290 mcg by mouth daily., Disp: , Rfl:  .  metoCLOPramide (REGLAN) 10 MG tablet, 3 (three) times daily before meals. , Disp: , Rfl:  .  montelukast (SINGULAIR) 10 MG tablet, Take 1 tablet (10 mg total) by mouth at bedtime., Disp: 30 tablet, Rfl: 5 .  olopatadine (PATANOL) 0.1 % ophthalmic solution, Place 1 drop into both eyes 2 (two) times daily., Disp: 5 mL, Rfl: 1 .  ondansetron (ZOFRAN) 8 MG tablet, Take 4 mg by mouth every 8 (eight) hours as needed for nausea or vomiting., Disp: , Rfl:  .  triamcinolone (NASACORT) 55 MCG/ACT AERO nasal inhaler,  Place 2 sprays into the nose daily., Disp: 1 Bottle, Rfl: 5 .  varenicline (CHANTIX STARTING MONTH PAK) 0.5 MG X 11 & 1 MG X 42 tablet, Take one 0.5 mg tablet by mouth once daily for 3 days, then increase to one 0.5 mg tablet twice daily for 4 days, then increase to one 1 mg tablet twice daily., Disp: 53 tablet, Rfl: 0 .  Vilazodone HCl (VIIBRYD) 40 MG TABS, Take 1 tablet (40 mg total) by mouth daily., Disp: 30 tablet, Rfl: 1   Allergies  Allergen Reactions  . Codeine Nausea And Vomiting     Review of Systems  Constitutional: Positive for unexpected weight change.  Respiratory: Negative.   Cardiovascular: Negative.   Gastrointestinal: Negative.   Skin:       States one of her toenails is lifting up. She has contacted her podiatrist, but has yet to hear back from the office.   Neurological: Negative.   Psychiatric/Behavioral: The patient is nervous/anxious.      Today's Vitals   08/17/18 1146  BP: 126/82  Pulse: 80  Temp: 98.3 F (36.8 C)  TempSrc: Oral  Weight: 226 lb 9.6 oz (102.8 kg)  Height: 5\' 5"  (1.651 m)  Body mass index is 37.71 kg/m.   Objective:  Physical Exam Vitals signs and nursing note reviewed.  Constitutional:      Appearance: Normal appearance.  HENT:     Head: Normocephalic and atraumatic.  Cardiovascular:     Rate and Rhythm: Normal rate and regular rhythm.     Heart sounds: Normal heart sounds.  Pulmonary:     Effort: Pulmonary effort is normal.     Breath sounds: Normal breath sounds.  Feet:     Comments: Left great toe has lifted from nail bed.  Skin:    General: Skin is warm.  Neurological:     General: No focal deficit present.     Mental Status: She is alert.  Psychiatric:        Mood and Affect: Mood normal.        Behavior: Behavior normal.         Assessment And Plan:     1. Anxiety  She was given starter pack of Viibryd. She will start with 10mg  daily x 7 days, then increase to 20mg  daily x 7 days. I will send in rx Viibryd  40mg  once daily. She will rto in 4-6 weeks for re-evaluation.   2. Onycholysis of toenail  Pt advised her toenail will likely fall off. She plans to f/u with Podiatry.   3. Gastroparesis  Chronic. I will refer her for second opinion to [facility] as requested.   - Ambulatory referral to Gastroenterology  4. Weight gain  She is again encouraged to increase her daily activity. She is advised to start with walking in her neighborhood.   5. Class 2 severe obesity due to excess calories with serious comorbidity and body mass index (BMI) of 37.0 to 37.9 in adult [facility])  Importance of achieving optimal weight to decrease risk of cardiovascular disease and cancers was discussed with the patient in full detail. She is encouraged to start slowly - start with 10 minutes twice daily at least three to four days per week and to gradually build to 30 minutes five days weekly. She was given tips to incorporate more activity into her daily routine - take stairs when possible, park farther away from her job, grocery stores, etc.    6. Drug therapy  - CBC no Diff - Vitamin B12   Maximino Greenland, MD    THE PATIENT IS ENCOURAGED TO PRACTICE SOCIAL DISTANCING DUE TO THE COVID-19 PANDEMIC.

## 2018-08-17 NOTE — Patient Instructions (Signed)

## 2018-08-18 ENCOUNTER — Encounter: Payer: Self-pay | Admitting: Podiatry

## 2018-08-18 ENCOUNTER — Ambulatory Visit: Payer: Managed Care, Other (non HMO) | Admitting: Podiatry

## 2018-08-18 DIAGNOSIS — L600 Ingrowing nail: Secondary | ICD-10-CM

## 2018-08-18 DIAGNOSIS — B351 Tinea unguium: Secondary | ICD-10-CM

## 2018-08-18 LAB — CBC
Hematocrit: 44.1 % (ref 34.0–46.6)
Hemoglobin: 13.9 g/dL (ref 11.1–15.9)
MCH: 26 pg — ABNORMAL LOW (ref 26.6–33.0)
MCHC: 31.5 g/dL (ref 31.5–35.7)
MCV: 83 fL (ref 79–97)
Platelets: 317 10*3/uL (ref 150–450)
RBC: 5.34 x10E6/uL — ABNORMAL HIGH (ref 3.77–5.28)
RDW: 14.1 % (ref 11.7–15.4)
WBC: 3.2 10*3/uL — ABNORMAL LOW (ref 3.4–10.8)

## 2018-08-18 LAB — VITAMIN B12: Vitamin B-12: 441 pg/mL (ref 232–1245)

## 2018-08-18 NOTE — Patient Instructions (Signed)

## 2018-08-20 ENCOUNTER — Encounter: Payer: Self-pay | Admitting: Internal Medicine

## 2018-08-21 NOTE — Progress Notes (Signed)
Subjective:   Patient ID: Kayla [name], female   DOB: 53 y.o.   MRN: [id number]   HPI Patient states she traumatized her left big toenail and it has loosened and is moderately bothersome for her.  States that she wants the entire nail removed due to the trauma   ROS      Objective:  Physical Exam  Neurovascular status intact with damage left hallux nail that is two thirds loose and localized drainage noted but no odor or purulent type drainage     Assessment:  Traumatized left nail that is partially loosened from the bed     Plan:  H&P discussed condition today I went ahead and infiltrated 60 mg like Marcaine mixture sterile removal of nail accomplished flush the bed and applied sterile dressing with no indications of damage to the underlying nail bed.  Instructed on soaks to leave dressing on 24 hours but take it off. Start to throb and will be seen back and understands ultimately permanent removal may be necessary

## 2018-08-28 ENCOUNTER — Encounter: Payer: Self-pay | Admitting: Adult Health

## 2018-08-28 ENCOUNTER — Other Ambulatory Visit: Payer: Self-pay

## 2018-08-28 ENCOUNTER — Ambulatory Visit: Payer: Managed Care, Other (non HMO) | Admitting: Adult Health

## 2018-08-28 ENCOUNTER — Ambulatory Visit (INDEPENDENT_AMBULATORY_CARE_PROVIDER_SITE_OTHER): Payer: Managed Care, Other (non HMO) | Admitting: *Deleted

## 2018-08-28 ENCOUNTER — Ambulatory Visit: Payer: Managed Care, Other (non HMO) | Admitting: Neurology

## 2018-08-28 VITALS — BP 116/81 | HR 65 | Temp 96.9°F | Ht 65.0 in | Wt 228.0 lb

## 2018-08-28 DIAGNOSIS — G4733 Obstructive sleep apnea (adult) (pediatric): Secondary | ICD-10-CM | POA: Diagnosis not present

## 2018-08-28 DIAGNOSIS — Z9989 Dependence on other enabling machines and devices: Secondary | ICD-10-CM

## 2018-08-28 DIAGNOSIS — J309 Allergic rhinitis, unspecified: Secondary | ICD-10-CM

## 2018-08-28 NOTE — Progress Notes (Signed)
PATIENT: Kayla [name] DOB: [birth year]  REASON FOR VISIT: follow up HISTORY FROM: patient  HISTORY OF PRESENT ILLNESS: Today 08/28/18:  Kayla [name] is a 53 year old female with a history of obstructive sleep apnea on CPAP.  She returns today for follow-up.  She states that she has not been using her CPAP.  She states that she would put it on at night and the next morning it would be on the floor.  She states that the mask was not comfortable.  She thinks that if she is able to get a new mask she can be consistent with the CPAP.  She also states that she will got the so clean machine but she does not like it as it made her mask have an unpleasant smell.  She returns today for follow-up.  HISTORY (Copied from Dr.Dohmeier's note) Rv: 10-26-2017, follow up after HST and CPAP therapy initiation.   The patient underwent a home sleep test on 08 June 2017, her AHI was just borderline 5.0 her RDI however was 9.2 and she did have some mild oxygen desaturation.  My concern was that her apnea was 67% obstructive and 33% central.  Snoring was noted, the degree of snoring is suspected to be quite loud given that her RDI exceeded her AHI.  She does have some bradycardia and tachycardia which indicates that she has a stress response to the even mild apnea.  Since the patient has other symptoms of dysautonomia including gastroparesis I have decided to place her on auto titration CPAP.  Her CPAP compliance is very high she has used it every day of the last 16XWRU she has certain days where she works at night on her next degree and will have to show to sleep times.  For this reason she is 85% compliant with time of use at 5 hours and 12 minutes on average.  AutoSet is set between a pressure window of 5 and 15 cmH2O with full-time EPR of 3 cmH2O she does have some major air leaks, her pressure is currently at the 95th percentile of 10.5 cm her residual AHI is 3.4.   I would not need to change any of the settings  but I would like for the patient to try for the mask that is most comfortable for her to wear she was just recently given a new mask which has decreased the air leaks following July 30 th.  If she can continue to use the machine as she does and may be add another hour of sleep she is truly compliant and I do not think that apnea would be still contributing to her excessive daytime sleepiness. The patient endorsed today the Epworth score of 19/ 24  points the fatigue severity score score of 54 points.  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  This may explain some of the fatigue as well. I will order HLA narcolepsy test. She is sleep deprived.   REVIEW OF SYSTEMS: Out of a complete 14 system review of symptoms, the patient complains only of the following symptoms, and all other reviewed systems are negative.  See HPI ESS 24  ALLERGIES: Allergies  Allergen Reactions  . Codeine Nausea And Vomiting    HOME MEDICATIONS: Outpatient Medications Prior to Visit  Medication Sig Dispense Refill  .  azelastine (ASTELIN) 0.1 % nasal spray Place 2 sprays into both nostrils 2 (two) times daily. 30 mL 5  . cetirizine (ZYRTEC) 10 MG tablet Take 1 tablet (10 mg total) by mouth daily. 30 tablet 5  . desonide (DESOWEN) 0.05 % cream Apply topically 2 (two) times daily. Use as needed 30 g 0  . levocetirizine (XYZAL) 5 MG tablet Take 1 tablet (5 mg total) by mouth every evening. 30 tablet 5  . Linaclotide (LINZESS) 290 MCG CAPS capsule Take 290 mcg by mouth daily.    . metoCLOPramide (REGLAN) 10 MG tablet 3 (three) times daily before meals.     . montelukast (SINGULAIR) 10 MG tablet Take 1 tablet (10 mg total) by mouth at bedtime. 30 tablet 5  . olopatadine (PATANOL) 0.1 % ophthalmic solution Place 1 drop into  both eyes 2 (two) times daily. 5 mL 1  . ondansetron (ZOFRAN) 8 MG tablet Take 4 mg by mouth every 8 (eight) hours as needed for nausea or vomiting.    . triamcinolone (NASACORT) 55 MCG/ACT AERO nasal inhaler Place 2 sprays into the nose daily. 1 Bottle 5  . varenicline (CHANTIX STARTING MONTH PAK) 0.5 MG X 11 & 1 MG X 42 tablet Take one 0.5 mg tablet by mouth once daily for 3 days, then increase to one 0.5 mg tablet twice daily for 4 days, then increase to one 1 mg tablet twice daily. 53 tablet 0  . Vilazodone HCl (VIIBRYD) 40 MG TABS Take 1 tablet (40 mg total) by mouth daily. 30 tablet 1   No facility-administered medications prior to visit.     PAST MEDICAL HISTORY: Past Medical History:  Diagnosis Date  . Arthritis   . Chronic back pain   . Chronic neck pain   . Chronic shoulder pain    left  . Knee pain   . Migraine   . Sciatica     PAST SURGICAL HISTORY: Past Surgical History:  Procedure Laterality Date  . CESAREAN SECTION    . FOOT SURGERY Left 2018  . LESION EXCISION Right    X3  . SHOULDER SURGERY Left 2015  . TUBAL LIGATION      FAMILY HISTORY: Family History  Problem Relation Age of Onset  . Cancer Mother   . Cancer Father     SOCIAL HISTORY: Social History   Socioeconomic History  . Marital status: Divorced    Spouse name: Not on file  . Number of children: Not on file  . Years of education: Not on file  . Highest education level: Not on file  Occupational History  . Not on file  Social Needs  . Financial resource strain: Not on file  . Food insecurity    Worry: Not on file    Inability: Not on file  . Transportation needs    Medical: Not on file    Non-medical: Not on file  Tobacco Use  . Smoking status: Light Tobacco Smoker    Packs/day: 0.25    Years: 34.00    Pack years: 8.50    Types: Cigarettes  . Smokeless tobacco: Never Used  Substance and Sexual Activity  . Alcohol use: Yes    Alcohol/week: 1.0 standard drinks    Types: 1  Glasses of wine per week    Comment: occasionally  . Drug use: No    Types: Marijuana    Comment: former user  . Sexual activity: Yes  Lifestyle  . Physical activity    Days per  week: Not on file    Minutes per session: Not on file  . Stress: Not on file  Relationships  . Social Herbalist on phone: Not on file    Gets together: Not on file    Attends religious service: Not on file    Active member of club or organization: Not on file    Attends meetings of clubs or organizations: Not on file    Relationship status: Not on file  . Intimate partner violence    Fear of current or ex partner: Not on file    Emotionally abused: Not on file    Physically abused: Not on file    Forced sexual activity: Not on file  Other Topics Concern  . Not on file  Social History Narrative  . Not on file      PHYSICAL EXAM  Vitals:   08/28/18 0925  BP: 116/81  Pulse: 65  Temp: (!) 96.9 F (36.1 C)  Weight: 228 lb (103.4 kg)  Height: 5' 5" (1.651 m)   Body mass index is 37.94 kg/m.  Generalized: Well developed, in no acute distress  CHEST: Lungs clear to auscultation bilaterally  Neurological examination  Mentation: Alert oriented to time, place, history taking. Follows all commands speech and language fluent Cranial nerve II-XII: Pupils were equal round reactive to light. Extraocular movements were full, visual field were full on confrontational test. Facial sensation and strength were normal. Uvula tongue midline. Head turning and shoulder shrug  were normal and symmetric. Motor: The motor testing reveals 5 over 5 strength of all 4 extremities. Good symmetric motor tone is noted throughout.  Gait and station: Gait is normal.  .   DIAGNOSTIC DATA (LABS, IMAGING, TESTING) - I reviewed patient records, labs, notes, testing and imaging myself where available.  Lab Results  Component Value Date   WBC 3.2 (L) 08/17/2018   HGB 13.9 08/17/2018   HCT 44.1 08/17/2018   MCV  83 08/17/2018   PLT 317 08/17/2018      Component Value Date/Time   NA 141 05/25/2018 1558   K 4.6 05/25/2018 1558   CL 102 05/25/2018 1558   CO2 22 05/25/2018 1558   GLUCOSE 88 05/25/2018 1558   GLUCOSE 77 06/09/2015 2011   BUN 8 05/25/2018 1558   CREATININE 0.92 05/25/2018 1558   CALCIUM 9.5 05/25/2018 1558   PROT 7.2 05/25/2018 1558   ALBUMIN 4.2 05/25/2018 1558   AST 16 05/25/2018 1558   ALT 13 05/25/2018 1558   ALKPHOS 77 05/25/2018 1558   BILITOT 0.2 05/25/2018 1558   GFRNONAA 72 05/25/2018 1558   GFRAA 83 05/25/2018 1558   No results found for: CHOL, HDL, LDLCALC, LDLDIRECT, TRIG, CHOLHDL Lab Results  Component Value Date   HGBA1C 5.5 05/25/2018   Lab Results  Component Value Date   VITAMINB12 441 08/17/2018   Lab Results  Component Value Date   TSH 0.535 05/25/2018      ASSESSMENT AND PLAN 53 y.o. year old female  has a past medical history of Arthritis, Chronic back pain, Chronic neck pain, Chronic shoulder pain, Knee pain, Migraine, and Sciatica. here with:  1.  Obstructive sleep apnea on CPAP  The patient has not been using her CPAP machine.  I will send her for mask refitting and hopefully a new mask will make it more comfortable for her to use the machine.  She was also encouraged to try to use machine during the day to help her  get used to it.  She is advised that if her symptoms worsen or she develops new symptoms she should let us know.  She will follow-up in 6 months or sooner if needed.   I spent 15 minutes with the patient. 50% of this time was spent reviewing her CPAP download  Ward Givens, MSN, NP-C 08/28/2018, 9:37 AM [facility] [address] [place], [place] [address] [phone]

## 2018-08-28 NOTE — Patient Instructions (Signed)
Your Plan:  Mask refitting Restart using CPAP Try using CPAP during the day so get used to using it.  If your symptoms worsen or you develop new symptoms please let us know.    Thank you for coming to see [place] at [facility]. I hope we have been able to provide you high quality care today.  You may receive a patient satisfaction survey over the next few weeks. We would appreciate your feedback and comments so that we may continue to improve ourselves and the health of our patients.

## 2018-08-29 NOTE — Progress Notes (Signed)
CMM sent and received by [facility] sy

## 2018-09-01 ENCOUNTER — Telehealth: Payer: Self-pay

## 2018-09-01 NOTE — Telephone Encounter (Signed)
I CALLED PT AND NOTIFIED HER THE DERMATOLOGIST HAS BEEN TRYING TO SCHEDULE HER AN APPOINTMENT BUT HAS BEEN UNABLE TO REACH HER I PROVIDED  PT WITH THEIR NUMBER SO SHE CAN CALL THEM TO SCHEDULE HER APPOINTMENT. Kayla [name]

## 2018-09-08 ENCOUNTER — Ambulatory Visit (INDEPENDENT_AMBULATORY_CARE_PROVIDER_SITE_OTHER): Payer: Managed Care, Other (non HMO) | Admitting: *Deleted

## 2018-09-08 DIAGNOSIS — J309 Allergic rhinitis, unspecified: Secondary | ICD-10-CM

## 2018-09-14 ENCOUNTER — Ambulatory Visit (INDEPENDENT_AMBULATORY_CARE_PROVIDER_SITE_OTHER): Payer: Managed Care, Other (non HMO) | Admitting: *Deleted

## 2018-09-14 DIAGNOSIS — J309 Allergic rhinitis, unspecified: Secondary | ICD-10-CM | POA: Diagnosis not present

## 2018-09-22 ENCOUNTER — Ambulatory Visit (INDEPENDENT_AMBULATORY_CARE_PROVIDER_SITE_OTHER): Payer: Managed Care, Other (non HMO) | Admitting: *Deleted

## 2018-09-22 DIAGNOSIS — J309 Allergic rhinitis, unspecified: Secondary | ICD-10-CM

## 2018-09-29 ENCOUNTER — Ambulatory Visit (INDEPENDENT_AMBULATORY_CARE_PROVIDER_SITE_OTHER): Payer: Managed Care, Other (non HMO) | Admitting: *Deleted

## 2018-09-29 DIAGNOSIS — J309 Allergic rhinitis, unspecified: Secondary | ICD-10-CM

## 2018-10-06 ENCOUNTER — Ambulatory Visit (INDEPENDENT_AMBULATORY_CARE_PROVIDER_SITE_OTHER): Payer: Managed Care, Other (non HMO) | Admitting: *Deleted

## 2018-10-06 DIAGNOSIS — J309 Allergic rhinitis, unspecified: Secondary | ICD-10-CM | POA: Diagnosis not present

## 2018-10-10 ENCOUNTER — Other Ambulatory Visit: Payer: Self-pay

## 2018-10-10 ENCOUNTER — Ambulatory Visit (INDEPENDENT_AMBULATORY_CARE_PROVIDER_SITE_OTHER): Payer: Managed Care, Other (non HMO) | Admitting: Allergy & Immunology

## 2018-10-10 ENCOUNTER — Encounter: Payer: Self-pay | Admitting: Allergy & Immunology

## 2018-10-10 VITALS — BP 118/82 | HR 81 | Temp 98.6°F | Resp 16 | Ht 65.0 in | Wt 225.0 lb

## 2018-10-10 DIAGNOSIS — J309 Allergic rhinitis, unspecified: Secondary | ICD-10-CM

## 2018-10-10 DIAGNOSIS — J302 Other seasonal allergic rhinitis: Secondary | ICD-10-CM

## 2018-10-10 DIAGNOSIS — T781XXD Other adverse food reactions, not elsewhere classified, subsequent encounter: Secondary | ICD-10-CM | POA: Diagnosis not present

## 2018-10-10 DIAGNOSIS — J3089 Other allergic rhinitis: Secondary | ICD-10-CM | POA: Diagnosis not present

## 2018-10-10 MED ORDER — AZELASTINE HCL 0.1 % NA SOLN: 2.0000 | Freq: Two times a day (BID) | NASAL | 5 refills | Status: DC

## 2018-10-10 MED ORDER — OLOPATADINE HCL 0.1 % OP SOLN: 1.0000 [drp] | Freq: Two times a day (BID) | OPHTHALMIC | 1 refills | Status: DC

## 2018-10-10 NOTE — Patient Instructions (Addendum)
1. Seasonal and perennial allergic rhinitis (grasses, ragweed, weeds, trees, indoor molds, outdoor molds, dust mites, cat and dog) - Continue allergy shots at the same schedule.  - Continue with: Zyrtec (cetirizine) 10mg  tablet once daily, Singulair (montelukast) 10mg  daily, Nasacort (triamcinolone) two sprays per nostril daily and Astelin (azelastine) 2 sprays per nostril 1-2 times daily as needed - Hopefully we can decrease dosing of medications once you are on a more effective allergy shot dosing regimen.   2. Adverse food reaction - unknown trigger - AuviQ up to date. - Previous testing was negative.   3. Return in about 6 months (around 04/12/2019). This can be an in-person, a virtual Webex or a telephone follow up visit.   Please inform us of any Emergency Department visits, hospitalizations, or changes in symptoms. Call us before going to the ED for breathing or allergy symptoms since we might be able to fit you in for a sick visit. Feel free to contact us anytime with any questions, problems, or concerns.  It was a pleasure to see you again today! BE SAFE!   Websites that have reliable patient information: 1. American Academy of Asthma, Allergy, and Immunology: www.aaaai.org 2. Food Allergy Research and Education (FARE): foodallergy.org 3. Mothers of Asthmatics: http://www.asthmacommunitynetwork.org 4. American College of Allergy, Asthma, and Immunology: www.acaai.org  "Like" [place] on Facebook and Instagram for our latest updates!      Make sure you are registered to vote! If you have moved or changed any of your contact information, you will need to get this updated before voting!  In some cases, you MAY be able to register to vote online: [email or web address]    Voter ID laws are NOT going into effect for the General Election in November 2020! DO NOT let this stop you from exercising your right to vote!   Absentee voting is the SAFEST way to vote  during the coronavirus pandemic!   Download and print an absentee ballot request form at [email or web address] or you can scan the QR code below with your smart phone:      More information on absentee ballots can be found here: [email or web address]

## 2018-10-10 NOTE — Progress Notes (Signed)
FOLLOW UP  Date of Service/Encounter:  10/10/18   Assessment:   Seasonal and perennial allergic rhinitis (grasses, ragweed, weeds, trees, indoor molds, outdoor molds, dust mites, cat and dog)  Adverse food reactions (tree nuts, wheat, grape) - with negative testing today (confirming with blood work)   Plan/Recommendations:   1. Seasonal and perennial allergic rhinitis (grasses, ragweed, weeds, trees, indoor molds, outdoor molds, dust mites, cat and dog) - Continue allergy shots at the same schedule.  - Continue with: Zyrtec (cetirizine) 10mg  tablet once daily, Singulair (montelukast) 10mg  daily, Nasacort (triamcinolone) two sprays per nostril daily and Astelin (azelastine) 2 sprays per nostril 1-2 times daily as needed - Hopefully we can decrease dosing of medications once you are on a more effective allergy shot dosing regimen.   2. Adverse food reaction - unknown trigger - AuviQ up to date. - Previous testing was negative.   3. Return in about 6 months (around 04/12/2019). This can be an in-person, a virtual Webex or a telephone follow up visit.  Subjective:   Kayla [name] is a 53 y.o. female presenting today for follow up of  Chief Complaint  Patient presents with  . Allergic Rhinitis     Kayla [name] has a history of the following: Patient Active Problem List   Diagnosis Date Noted  . Adverse food reaction 06/29/2018  . Seasonal and perennial allergic rhinitis 06/29/2018  . Excessive daytime sleepiness 04/28/2017  . 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 Sleep-related headache 04/28/2017  . GERD with apnea 04/28/2017    History obtained from: chart review and patient.  Kayla [name] is a 53 y.o. female presenting for a follow up visit.  She was last seen in April 2020 as a new patient.  At that time, she underwent environmental allergy testing that was positive to a multitude of pollens, molds, dust mite, cat, and dog.  We continued with Zyrtec 10 mg  daily and started Singulair 10 mg daily, Nasacort 2 sprays per nostril daily, and Astelin 2 sprays per nostril up to twice daily.  She did good decision to start allergen immunotherapy and has been coming regularly for shots.  She had a number of food allergens tested which were all negative.  We did provide her with a prescription for an Auvi-Q epinephrine autoinjector to be on the safe side.  Since last visit, she has done well. She has been rather compliant with her allergy shots and has done well with these. She did have a large local reaction last week (4+), which she describes as a "warm egg". It resolved with a cold compress and antihistamines. She is good about taking her antihistamines especially before allergy shots. She does feel that she is getting some symptomatic relief from her shots at this point.    Kayla [name] is on allergen immunotherapy. She receives two injections. Immunotherapy script #1 contains trees, weeds, grasses, cat and dog. She currently receives 0.82mL of the GOLD vial (1/10,000). Immunotherapy script #2 contains ragweed, molds and dust mites. She currently receives 0.39mL of the GOLD vial (1/10,000). She started shots April of 2020 and not yet reached maintenance.   She has had no allergic reactions at all. She does carry her Wynona Luna and it is up to date. She notes no other allergic triggers.   Otherwise, there have been no changes to her past medical history, surgical history, family history, or social history.    Review of Systems  Constitutional: Negative.  Negative for chills, fever, malaise/fatigue and  weight loss.  HENT: Positive for congestion and sore throat. Negative for ear discharge and ear pain.   Eyes: Negative for pain, discharge and redness.  Respiratory: Negative for cough, sputum production, shortness of breath and wheezing.   Cardiovascular: Negative.  Negative for chest pain and palpitations.  Gastrointestinal: Negative for abdominal pain, heartburn,  nausea and vomiting.  Skin: Negative.  Negative for itching and rash.  Neurological: Negative for dizziness and headaches.  Endo/Heme/Allergies: Negative for environmental allergies. Does not bruise/bleed easily.       Objective:   Blood pressure 118/82, pulse 81, temperature 98.6 F (37 C), temperature source Temporal, resp. rate 16, height 5\' 5"  (1.651 m), weight 225 lb (102.1 kg), SpO2 98 %. Body mass index is 37.44 kg/m.   Physical Exam:  Physical Exam  Constitutional: She appears well-developed.  HENT:  Head: Normocephalic and atraumatic.  Right Ear: Tympanic membrane, external ear and ear canal normal.  Left Ear: Tympanic membrane, external ear and ear canal normal.  Nose: Mucosal edema and rhinorrhea present. No nasal deformity or septal deviation. No epistaxis. Right sinus exhibits no maxillary sinus tenderness and no frontal sinus tenderness. Left sinus exhibits no maxillary sinus tenderness and no frontal sinus tenderness.  Mouth/Throat: Uvula is midline and oropharynx is clear and moist. Mucous membranes are not pale and not dry.  Cobblestoning present in the posterior oropharynx.   Eyes: Pupils are equal, round, and reactive to light. Conjunctivae and EOM are normal. Right eye exhibits no chemosis and no discharge. Left eye exhibits no chemosis and no discharge. Right conjunctiva is not injected. Left conjunctiva is not injected.  Cardiovascular: Normal rate, regular rhythm and normal heart sounds.  Respiratory: Effort normal and breath sounds normal. No accessory muscle usage. No tachypnea. No respiratory distress. She has no wheezes. She has no rhonchi. She has no rales. She exhibits no tenderness.  Lymphadenopathy:    She has no cervical adenopathy.  Neurological: She is alert.  Skin: No abrasion, no petechiae and no rash noted. Rash is not papular, not vesicular and not urticarial. No erythema. No pallor.  No eczematous or urticarial lesions noted.   Psychiatric:  She has a normal mood and affect.     Diagnostic studies: none    Salvatore Marvel, MD  [facility]

## 2018-10-12 ENCOUNTER — Encounter: Payer: Self-pay | Admitting: Internal Medicine

## 2018-10-12 ENCOUNTER — Ambulatory Visit: Payer: Managed Care, Other (non HMO) | Admitting: Internal Medicine

## 2018-10-12 ENCOUNTER — Other Ambulatory Visit: Payer: Self-pay

## 2018-10-12 VITALS — BP 108/74 | HR 75 | Temp 98.1°F | Ht 65.0 in | Wt 225.2 lb

## 2018-10-12 DIAGNOSIS — Z79899 Other long term (current) drug therapy: Secondary | ICD-10-CM

## 2018-10-12 DIAGNOSIS — F331 Major depressive disorder, recurrent, moderate: Secondary | ICD-10-CM | POA: Insufficient documentation

## 2018-10-12 DIAGNOSIS — R102 Pelvic and perineal pain: Secondary | ICD-10-CM | POA: Diagnosis not present

## 2018-10-12 DIAGNOSIS — E6609 Other obesity due to excess calories: Secondary | ICD-10-CM

## 2018-10-12 DIAGNOSIS — Z139 Encounter for screening, unspecified: Secondary | ICD-10-CM | POA: Diagnosis not present

## 2018-10-12 DIAGNOSIS — Z6837 Body mass index (BMI) 37.0-37.9, adult: Secondary | ICD-10-CM

## 2018-10-12 LAB — POCT URINALYSIS DIPSTICK
Bilirubin, UA: NEGATIVE
Blood, UA: NEGATIVE
Glucose, UA: NEGATIVE
Ketones, UA: NEGATIVE
Leukocytes, UA: NEGATIVE
Nitrite, UA: NEGATIVE
Protein, UA: NEGATIVE
Spec Grav, UA: >=1.03 — AB (ref 1.010–1.025)
Urobilinogen, UA: 0.2 E.U./dL
pH, UA: 6.5 (ref 5.0–8.0)

## 2018-10-12 NOTE — Patient Instructions (Signed)

## 2018-10-12 NOTE — Progress Notes (Signed)
Subjective:     Patient ID: Kayla [name] , female    DOB: 10-13-1965 , 53 y.o.   MRN: [id number]   Chief Complaint  Patient presents with  . Depression    HPI  She is here today for f/u depression. Unfortunately, she never filled rx for Viibryd. She reports that she tried to get from pharmacy, but it was $100. She admits that she did not call us for savings card. She felt fine while on the medication.   Depression        This is a chronic problem.  The current episode started 1 to 4 weeks ago.   Associated symptoms include decreased concentration and fatigue.     The symptoms are aggravated by work stress.  Past treatments include SSRIs - Selective serotonin reuptake inhibitors and SNRIs - Serotonin and norepinephrine reuptake inhibitors.  Past compliance problems include insurance issues.  Previous treatment provided mild relief.    Past Medical History:  Diagnosis Date  . Arthritis   . Chronic back pain   . Chronic neck pain   . Chronic shoulder pain    left  . Knee pain   . Migraine   . Sciatica      Family History  Problem Relation Age of Onset  . Cancer Mother   . Cancer Father      Current Outpatient Medications:  .  azelastine (ASTELIN) 0.1 % nasal spray, Place 2 sprays into both nostrils 2 (two) times daily., Disp: 30 mL, Rfl: 5 .  cetirizine (ZYRTEC) 10 MG tablet, Take 1 tablet (10 mg total) by mouth daily., Disp: 30 tablet, Rfl: 5 .  desonide (DESOWEN) 0.05 % cream, Apply topically 2 (two) times daily. Use as needed, Disp: 30 g, Rfl: 0 .  levocetirizine (XYZAL) 5 MG tablet, Take 1 tablet (5 mg total) by mouth every evening., Disp: 30 tablet, Rfl: 5 .  Linaclotide (LINZESS) 290 MCG CAPS capsule, Take 290 mcg by mouth daily., Disp: , Rfl:  .  metoCLOPramide (REGLAN) 10 MG tablet, 3 (three) times daily before meals. , Disp: , Rfl:  .  montelukast (SINGULAIR) 10 MG tablet, Take 1 tablet (10 mg total) by mouth at bedtime., Disp: 30 tablet, Rfl: 5 .  olopatadine  (PATANOL) 0.1 % ophthalmic solution, Place 1 drop into both eyes 2 (two) times daily., Disp: 5 mL, Rfl: 1 .  ondansetron (ZOFRAN) 8 MG tablet, Take 4 mg by mouth every 8 (eight) hours as needed for nausea or vomiting., Disp: , Rfl:  .  triamcinolone (NASACORT) 55 MCG/ACT AERO nasal inhaler, Place 2 sprays into the nose daily., Disp: 1 Bottle, Rfl: 5 .  Vilazodone HCl (VIIBRYD) 40 MG TABS, Take 1 tablet (40 mg total) by mouth daily. (Patient not taking: Reported on 10/10/2018), Disp: 30 tablet, Rfl: 1   Allergies  Allergen Reactions  . Codeine Nausea And Vomiting     Review of Systems  Constitutional: Positive for fatigue.  Respiratory: Negative.   Cardiovascular: Negative.   Gastrointestinal: Negative.   Genitourinary:       She c/o pelvic pain, occurs over her bladder. Unable to determine what triggers these sharp, stabbing pains. She denies urinary urgency, frequency and dysuria. She is unable to determine what triggers her sx.   Neurological: Negative.   Psychiatric/Behavioral: Positive for decreased concentration and depression.     Today's Vitals   10/12/18 1600  BP: 108/74  Pulse: 75  Temp: 98.1 F (36.7 C)  TempSrc: Oral  Weight: 225  lb 3.2 oz (102.2 kg)  Height: 5\' 5"  (1.651 m)   Body mass index is 37.48 kg/m.   Objective:  Physical Exam Vitals signs and nursing note reviewed.  Constitutional:      Appearance: Normal appearance.  HENT:     Head: Normocephalic and atraumatic.  Cardiovascular:     Rate and Rhythm: Normal rate and regular rhythm.     Heart sounds: Normal heart sounds.  Pulmonary:     Effort: Pulmonary effort is normal.     Breath sounds: Normal breath sounds.  Skin:    General: Skin is warm.  Neurological:     General: No focal deficit present.     Mental Status: She is alert.  Psychiatric:        Mood and Affect: Mood normal.        Behavior: Behavior normal.         Assessment And Plan:     1. Moderate episode of recurrent major  depressive disorder (HCC)  Chronic. I will restart Viibryd, 10mg  daily x 7 days, then 20mg  daily. She will rto in six weeks for her previously scheduled physical exam. She was also given samples of Viibryd and a savings card.   2. Class 2 obesity due to excess calories without serious comorbidity with body mass index (BMI) of 37.0 to 37.9 in adult  Importance of achieving optimal weight to decrease risk of cardiovascular disease and cancers was discussed with the patient in full detail. She is encouraged to start slowly - start with 10 minutes twice daily at least three to four days per week and to gradually build to 30 minutes five days weekly. She was given tips to incorporate more activity into her daily routine - take stairs when possible, park farther away from her job, grocery stores, etc.    3. Encounter for screening  She has requested COVID testing.   - Novel Coronavirus, NAA ([facility])  4. Pelvic pain  She denies vaginal d/c, but admits she is sexually active with a new partner. I will check urine for below.   - Chlamydia/Gonococcus/Trichomonas, NAA - POCT Urinalysis Dipstick (81002)  5. Drug therapy  - SAR CoV2 Serology (COVID 19)AB(IGG)IA        Maximino Greenland, MD    THE PATIENT IS ENCOURAGED TO PRACTICE SOCIAL DISTANCING DUE TO THE COVID-19 PANDEMIC.

## 2018-10-13 LAB — SAR COV2 SEROLOGY (COVID19)AB(IGG),IA: SARS-CoV-2 Ab, IgG: NEGATIVE

## 2018-10-15 LAB — NOVEL CORONAVIRUS, NAA: SARS-CoV-2, NAA: NOT DETECTED

## 2018-10-18 ENCOUNTER — Ambulatory Visit (INDEPENDENT_AMBULATORY_CARE_PROVIDER_SITE_OTHER): Payer: Managed Care, Other (non HMO) | Admitting: *Deleted

## 2018-10-18 DIAGNOSIS — J309 Allergic rhinitis, unspecified: Secondary | ICD-10-CM | POA: Diagnosis not present

## 2018-10-19 LAB — CHLAMYDIA/GONOCOCCUS/TRICHOMONAS, NAA
Chlamydia by NAA: NEGATIVE
Gonococcus by NAA: NEGATIVE
Trich vag by NAA: NEGATIVE

## 2018-10-20 ENCOUNTER — Encounter: Payer: Self-pay | Admitting: Internal Medicine

## 2018-10-27 ENCOUNTER — Ambulatory Visit (INDEPENDENT_AMBULATORY_CARE_PROVIDER_SITE_OTHER): Payer: Managed Care, Other (non HMO) | Admitting: *Deleted

## 2018-10-27 DIAGNOSIS — J309 Allergic rhinitis, unspecified: Secondary | ICD-10-CM

## 2018-10-31 ENCOUNTER — Encounter: Payer: Self-pay | Admitting: Internal Medicine

## 2018-11-06 ENCOUNTER — Ambulatory Visit: Payer: Self-pay | Admitting: *Deleted

## 2018-11-10 ENCOUNTER — Ambulatory Visit (INDEPENDENT_AMBULATORY_CARE_PROVIDER_SITE_OTHER): Payer: Managed Care, Other (non HMO)

## 2018-11-10 DIAGNOSIS — J309 Allergic rhinitis, unspecified: Secondary | ICD-10-CM

## 2018-11-17 ENCOUNTER — Ambulatory Visit (INDEPENDENT_AMBULATORY_CARE_PROVIDER_SITE_OTHER): Payer: Managed Care, Other (non HMO) | Admitting: *Deleted

## 2018-11-17 DIAGNOSIS — J309 Allergic rhinitis, unspecified: Secondary | ICD-10-CM

## 2018-11-23 ENCOUNTER — Encounter: Payer: Self-pay | Admitting: Internal Medicine

## 2018-11-24 ENCOUNTER — Ambulatory Visit (INDEPENDENT_AMBULATORY_CARE_PROVIDER_SITE_OTHER): Payer: Managed Care, Other (non HMO)

## 2018-11-24 ENCOUNTER — Ambulatory Visit: Payer: Managed Care, Other (non HMO)

## 2018-11-24 ENCOUNTER — Other Ambulatory Visit: Payer: Self-pay

## 2018-11-24 VITALS — BP 126/76 | HR 81 | Temp 97.8°F

## 2018-11-24 DIAGNOSIS — Z139 Encounter for screening, unspecified: Secondary | ICD-10-CM

## 2018-11-24 DIAGNOSIS — J309 Allergic rhinitis, unspecified: Secondary | ICD-10-CM

## 2018-11-24 NOTE — Progress Notes (Signed)
Pt came for covid screening. Was around someone who was exposed. Both are being tested.

## 2018-11-25 LAB — NOVEL CORONAVIRUS, NAA: SARS-CoV-2, NAA: NOT DETECTED

## 2018-11-30 ENCOUNTER — Encounter: Payer: Managed Care, Other (non HMO) | Admitting: Internal Medicine

## 2018-12-01 ENCOUNTER — Ambulatory Visit (INDEPENDENT_AMBULATORY_CARE_PROVIDER_SITE_OTHER): Payer: Managed Care, Other (non HMO) | Admitting: *Deleted

## 2018-12-01 DIAGNOSIS — J309 Allergic rhinitis, unspecified: Secondary | ICD-10-CM

## 2018-12-04 ENCOUNTER — Encounter: Payer: Self-pay | Admitting: Nurse Practitioner

## 2018-12-04 ENCOUNTER — Ambulatory Visit (INDEPENDENT_AMBULATORY_CARE_PROVIDER_SITE_OTHER): Payer: Managed Care, Other (non HMO) | Admitting: Nurse Practitioner

## 2018-12-04 ENCOUNTER — Other Ambulatory Visit: Payer: Self-pay

## 2018-12-04 VITALS — BP 110/82 | HR 80 | Temp 98.9°F | Wt 218.6 lb

## 2018-12-04 DIAGNOSIS — Z139 Encounter for screening, unspecified: Secondary | ICD-10-CM

## 2018-12-04 DIAGNOSIS — Z0000 Encounter for general adult medical examination without abnormal findings: Secondary | ICD-10-CM

## 2018-12-04 DIAGNOSIS — F419 Anxiety disorder, unspecified: Secondary | ICD-10-CM

## 2018-12-04 DIAGNOSIS — Z720 Tobacco use: Secondary | ICD-10-CM

## 2018-12-04 DIAGNOSIS — Z6837 Body mass index (BMI) 37.0-37.9, adult: Secondary | ICD-10-CM

## 2018-12-04 DIAGNOSIS — K3184 Gastroparesis: Secondary | ICD-10-CM

## 2018-12-04 LAB — POCT URINALYSIS DIPSTICK
Bilirubin, UA: NEGATIVE
Glucose, UA: NEGATIVE
Ketones, UA: NEGATIVE
Leukocytes, UA: NEGATIVE
Nitrite, UA: NEGATIVE
Protein, UA: NEGATIVE
Spec Grav, UA: 1.025 (ref 1.010–1.025)
Urobilinogen, UA: 0.2 E.U./dL
pH, UA: 7 (ref 5.0–8.0)

## 2018-12-04 NOTE — Patient Instructions (Signed)
Gardenia Phlegm Health Maintenance, Female Adopting a healthy lifestyle and getting preventive care are important in promoting health and wellness. Ask your health care provider about:  The right schedule for you to have regular tests and exams.  Things you can do on your own to prevent diseases and keep yourself healthy. What should I know about diet, weight, and exercise? Eat a healthy diet   Eat a diet that includes plenty of vegetables, fruits, low-fat dairy products, and lean protein.  Do not eat a lot of foods that are high in solid fats, added sugars, or sodium. Maintain a healthy weight Body mass index (BMI) is used to identify weight problems. It estimates body fat based on height and weight. Your health care provider can help determine your BMI and help you achieve or maintain a healthy weight. Get regular exercise Get regular exercise. This is one of the most important things you can do for your health. Most adults should:  Exercise for at least 150 minutes each week. The exercise should increase your heart rate and make you sweat (moderate-intensity exercise).  Do strengthening exercises at least twice a week. This is in addition to the moderate-intensity exercise.  Spend less time sitting. Even light physical activity can be beneficial. Watch cholesterol and blood lipids Have your blood tested for lipids and cholesterol at 52 years of age, then have this test every 5 years. Have your cholesterol levels checked more often if:  Your lipid or cholesterol levels are high.  You are older than 52 years of age.  You are at high risk for heart disease. What should I know about cancer screening? Depending on your health history and family history, you may need to have cancer screening at various ages. This may include screening for:  Breast cancer.  Cervical cancer.  Colorectal cancer.  Skin cancer.  Lung cancer. What should I know about heart disease, diabetes, and high blood  pressure? Blood pressure and heart disease  High blood pressure causes heart disease and increases the risk of stroke. This is more likely to develop in people who have high blood pressure readings, are of African descent, or are overweight.  Have your blood pressure checked: ? Every 3-5 years if you are 53-31 years of age. ? Every year if you are 80 years old or older. Diabetes Have regular diabetes screenings. This checks your fasting blood sugar level. Have the screening done:  Once every three years after age 61 if you are at a normal weight and have a low risk for diabetes.  More often and at a younger age if you are overweight or have a high risk for diabetes. What should I know about preventing infection? Hepatitis B If you have a higher risk for hepatitis B, you should be screened for this virus. Talk with your health care provider to find out if you are at risk for hepatitis B infection. Hepatitis C Testing is recommended for:  Everyone born from [birth year] through 1964.  Anyone with known risk factors for hepatitis C. Sexually transmitted infections (STIs)  Get screened for STIs, including gonorrhea and chlamydia, if: ? You are sexually active and are younger than 52 years of age. ? You are older than 52 years of age and your health care provider tells you that you are at risk for this type of infection. ? Your sexual activity has changed since you were last screened, and you are at increased risk for chlamydia or gonorrhea. Ask your health care provider  if you are at risk.  Ask your health care provider about whether you are at high risk for HIV. Your health care provider may recommend a prescription medicine to help prevent HIV infection. If you choose to take medicine to prevent HIV, you should first get tested for HIV. You should then be tested every 3 months for as long as you are taking the medicine. Pregnancy  If you are about to stop having your period (premenopausal) and  you may become pregnant, seek counseling before you get pregnant.  Take 400 to 800 micrograms (mcg) of folic acid every day if you become pregnant.  Ask for birth control (contraception) if you want to prevent pregnancy. Osteoporosis and menopause Osteoporosis is a disease in which the bones lose minerals and strength with aging. This can result in bone fractures. If you are 59 years old or older, or if you are at risk for osteoporosis and fractures, ask your health care provider if you should:  Be screened for bone loss.  Take a calcium or vitamin D supplement to lower your risk of fractures.  Be given hormone replacement therapy (HRT) to treat symptoms of menopause. Follow these instructions at home: Lifestyle  Do not use any products that contain nicotine or tobacco, such as cigarettes, e-cigarettes, and chewing tobacco. If you need help quitting, ask your health care provider.  Do not use street drugs.  Do not share needles.  Ask your health care provider for help if you need support or information about quitting drugs. Alcohol use  Do not drink alcohol if: ? Your health care provider tells you not to drink. ? You are pregnant, may be pregnant, or are planning to become pregnant.  If you drink alcohol: ? Limit how much you use to 0-1 drink a day. ? Limit intake if you are breastfeeding.  Be aware of how much alcohol is in your drink. In the [place] one drink equals one 12 oz bottle of beer (355 mL), one 5 oz glass of wine (148 mL), or one 1 oz glass of hard liquor (44 mL). General instructions  Schedule regular health, dental, and eye exams.  Stay current with your vaccines.  Tell your health care provider if: ? You often feel depressed. ? You have ever been abused or do not feel safe at home. Summary  Adopting a healthy lifestyle and getting preventive care are important in promoting health and wellness.  Follow your health care provider's instructions about healthy  diet, exercising, and getting tested or screened for diseases.  Follow your health care provider's instructions on monitoring your cholesterol and blood pressure. This information is not intended to replace advice given to you by your health care provider. Make sure you discuss any questions you have with your health care provider. Document Released: 09/14/2010 Document Revised: 02/22/2018 Document Reviewed: 02/22/2018 Elsevier Patient Education  2020 [employer].

## 2018-12-04 NOTE — Progress Notes (Signed)
Subjective:     Patient ID: Kayla [name] , female    DOB: May 19, 1965 , 53 y.o.   MRN: [id number]   Chief Complaint  Patient presents with  . Annual Exam    HPI  Here for HM  The patient states she uses status post hysterectomy for birth control.  No LMP recorded (lmp unknown). Patient is postmenopausal..  Mammogram last done 03/21/2018. Negative for: breast discharge, breast lump(s), breast pain and breast self exam.  Pertinent negatives include anxiety, decreased libido, depression, difficulty falling sleep, dyspareunia, history of infertility, nocturia, sexual dysfunction, sleep disturbances, urinary incontinence, urinary urgency, vaginal discharge and vaginal itching. Diet regular, she is trying to avoid sugars and carbs.  The patient states her exercise level is  daily and walk 2-3 miles per day     The patient's tobacco use is:  Social History   Tobacco Use  Smoking Status Light Tobacco Smoker  . Packs/day: 0.25  . Years: 34.00  . Pack years: 8.50  . Types: Cigarettes  Smokeless Tobacco Never Used  She has been exposed to passive smoke. She is down to two cigarettes per day.    The patient's alcohol use is:  Social History   Substance and Sexual Activity  Alcohol Use Yes  . Alcohol/week: 1.0 standard drinks  . Types: 1 Glasses of wine per week   Comment: occasionally    Past Medical History:  Diagnosis Date  . Arthritis   . Chronic back pain   . Chronic neck pain   . Chronic shoulder pain    left  . Knee pain   . Migraine   . Sciatica      Family History  Problem Relation Age of Onset  . Cancer Mother   . Cancer Father      Current Outpatient Medications:  .  azelastine (ASTELIN) 0.1 % nasal spray, Place 2 sprays into both nostrils 2 (two) times daily., Disp: 30 mL, Rfl: 5 .  cetirizine (ZYRTEC) 10 MG tablet, Take 1 tablet (10 mg total) by mouth daily., Disp: 30 tablet, Rfl: 5 .  desonide (DESOWEN) 0.05 % cream, Apply topically 2 (two) times  daily. Use as needed, Disp: 30 g, Rfl: 0 .  levocetirizine (XYZAL) 5 MG tablet, Take 1 tablet (5 mg total) by mouth every evening., Disp: 30 tablet, Rfl: 5 .  Linaclotide (LINZESS) 290 MCG CAPS capsule, Take 290 mcg by mouth daily., Disp: , Rfl:  .  montelukast (SINGULAIR) 10 MG tablet, Take 1 tablet (10 mg total) by mouth at bedtime., Disp: 30 tablet, Rfl: 5 .  olopatadine (PATANOL) 0.1 % ophthalmic solution, Place 1 drop into both eyes 2 (two) times daily., Disp: 5 mL, Rfl: 1 .  ondansetron (ZOFRAN) 8 MG tablet, Take 4 mg by mouth every 8 (eight) hours as needed for nausea or vomiting., Disp: , Rfl:  .  triamcinolone (NASACORT) 55 MCG/ACT AERO nasal inhaler, Place 2 sprays into the nose daily., Disp: 1 Bottle, Rfl: 5 .  Vilazodone HCl (VIIBRYD) 40 MG TABS, Take 1 tablet (40 mg total) by mouth daily., Disp: 30 tablet, Rfl: 1   Allergies  Allergen Reactions  . Codeine Nausea And Vomiting     Review of Systems  Constitutional: Negative.   HENT: Negative.   Eyes: Negative.   Respiratory: Negative.  Negative for cough.   Cardiovascular: Negative.  Negative for chest pain, palpitations and leg swelling.  Gastrointestinal: Negative.   Endocrine: Negative.   Genitourinary: Negative.   Musculoskeletal: Negative.  Skin: Negative.   Allergic/Immunologic: Negative.   Neurological: Negative.  Negative for dizziness and headaches.  Hematological: Negative.   Psychiatric/Behavioral: Negative.      Today's Vitals   12/04/18 0937  BP: 110/82  Pulse: 80  Temp: 98.9 F (37.2 C)  TempSrc: Oral  Weight: 218 lb 9.6 oz (99.2 kg)  PainSc: 0-No pain   Body mass index is 36.38 kg/m.   Objective:  Physical Exam Vitals signs reviewed.  Constitutional:      Appearance: Normal appearance. She is well-developed.  HENT:     Head: Normocephalic and atraumatic.     Right Ear: Hearing, tympanic membrane, ear canal and external ear normal. There is no impacted cerumen.     Left Ear: Hearing,  tympanic membrane, ear canal and external ear normal. There is no impacted cerumen.     Nose: Nose normal.     Mouth/Throat:     Mouth: Mucous membranes are moist.  Eyes:     General: Lids are normal.     Conjunctiva/sclera: Conjunctivae normal.     Pupils: Pupils are equal, round, and reactive to light.     Funduscopic exam:    Right eye: No papilledema.        Left eye: No papilledema.  Neck:     Musculoskeletal: Full passive range of motion without pain, normal range of motion and neck supple.     Thyroid: No thyroid mass.     Vascular: No carotid bruit.  Cardiovascular:     Rate and Rhythm: Normal rate and regular rhythm.     Pulses: Normal pulses.     Heart sounds: Normal heart sounds. No murmur.  Pulmonary:     Effort: Pulmonary effort is normal.     Breath sounds: Normal breath sounds.  Abdominal:     General: Abdomen is flat. Bowel sounds are normal.     Palpations: Abdomen is soft.  Musculoskeletal: Normal range of motion.        General: No swelling.     Right lower leg: No edema.     Left lower leg: No edema.  Skin:    General: Skin is warm and dry.     Capillary Refill: Capillary refill takes less than 2 seconds.  Neurological:     General: No focal deficit present.     Mental Status: She is alert and oriented to person, place, and time.     Cranial Nerves: No cranial nerve deficit.     Sensory: No sensory deficit.  Psychiatric:        Mood and Affect: Mood normal.        Behavior: Behavior normal.        Thought Content: Thought content normal.        Judgment: Judgment normal.         Assessment And Plan:     1. Health maintenance examination . Behavior modifications discussed and diet history reviewed.   . Pt will continue to exercise regularly and modify diet with low GI, plant based foods and decrease intake of processed foods.  . Recommend intake of daily multivitamin, Vitamin D, and calcium.  . Recommend mammogram and colonoscopy for preventive  screenings, as well as recommend immunizations that include influenza, TDAP - BMP8+eGFR - CBC no Diff - Lipid panel  2. Anxiety  Chronic, fairly stable and controlled with natural treatment  Encouraged to take magnesium daily in evening - BMP8+eGFR  3. Class 2 severe obesity due to excess calories with  serious comorbidity and body mass index (BMI) of 37.0 to 37.9 in adult [facility])  Chronic  Discussed healthy diet and regular exercise options   Encouraged to exercise at least 150 minutes per week with 2 days of strength training  4. Encounter for screening  - HIV antibody (with reflex)  5. Gastroparesis  Chronic, she is being followed by GI  6. Tobacco abuse Smoking cessation instruction/counseling given:  counseled patient on the dangers of tobacco use, advised patient to stop smoking, and reviewed strategies to maximize success  Ready to quit: Yes Counseling given: Yes     Minette Brine, FNP    THE PATIENT IS ENCOURAGED TO PRACTICE SOCIAL DISTANCING DUE TO THE COVID-19 PANDEMIC.

## 2018-12-05 LAB — BMP8+EGFR
BUN/Creatinine Ratio: 14 (ref 9–23)
BUN: 12 mg/dL (ref 6–24)
CO2: 26 mmol/L (ref 20–29)
Calcium: 9.7 mg/dL (ref 8.7–10.2)
Chloride: 103 mmol/L (ref 96–106)
Creatinine, Ser: 0.84 mg/dL (ref 0.57–1.00)
GFR calc Af Amer: 92 mL/min/{1.73_m2} (ref >59)
GFR calc non Af Amer: 80 mL/min/{1.73_m2} (ref >59)
Glucose: 81 mg/dL (ref 65–99)
Potassium: 4.4 mmol/L (ref 3.5–5.2)
Sodium: 142 mmol/L (ref 134–144)

## 2018-12-05 LAB — LIPID PANEL
Chol/HDL Ratio: 2.7 ratio (ref 0.0–4.4)
Cholesterol, Total: 226 mg/dL — ABNORMAL HIGH (ref 100–199)
HDL: 84 mg/dL (ref >39)
LDL Chol Calc (NIH): 128 mg/dL — ABNORMAL HIGH (ref 0–99)
Triglycerides: 83 mg/dL (ref 0–149)
VLDL Cholesterol Cal: 14 mg/dL (ref 5–40)

## 2018-12-05 LAB — CBC
Hematocrit: 42.2 % (ref 34.0–46.6)
Hemoglobin: 13.7 g/dL (ref 11.1–15.9)
MCH: 26 pg — ABNORMAL LOW (ref 26.6–33.0)
MCHC: 32.5 g/dL (ref 31.5–35.7)
MCV: 80 fL (ref 79–97)
Platelets: 290 10*3/uL (ref 150–450)
RBC: 5.27 x10E6/uL (ref 3.77–5.28)
RDW: 14.1 % (ref 11.7–15.4)
WBC: 4.2 10*3/uL (ref 3.4–10.8)

## 2018-12-05 LAB — HIV ANTIBODY (ROUTINE TESTING W REFLEX): HIV Screen 4th Generation wRfx: NONREACTIVE

## 2018-12-10 ENCOUNTER — Encounter: Payer: Self-pay | Admitting: Nurse Practitioner

## 2018-12-11 ENCOUNTER — Encounter: Payer: Self-pay | Admitting: Internal Medicine

## 2018-12-11 ENCOUNTER — Ambulatory Visit (INDEPENDENT_AMBULATORY_CARE_PROVIDER_SITE_OTHER): Payer: Managed Care, Other (non HMO)

## 2018-12-11 ENCOUNTER — Telehealth: Payer: Self-pay

## 2018-12-11 DIAGNOSIS — J309 Allergic rhinitis, unspecified: Secondary | ICD-10-CM | POA: Diagnosis not present

## 2018-12-11 MED ORDER — PREDNISONE 10 MG PO TABS: ORAL_TABLET | ORAL | 0 refills | Status: DC

## 2018-12-11 NOTE — Telephone Encounter (Signed)
I was informed that the hives are getting larger and coalescing on her arms.  She is experiencing angioedema, cardiopulmonary symptoms, or GI symptoms. Please call in prednisone, 20 mg x 4 days, 10 mg x1 day, then stop. I have also recommended taking diphenhydramine and/or cetirizine as needed for the next few days. Thanks.

## 2018-12-11 NOTE — Telephone Encounter (Signed)
Patient called and stated that she is having an allergic reaction to her allergy injections that she received today around 2 pm. Patient that that she has small bumps on her wrist and arm. She stated they look like little water bumps. Patient also stated that she is itching mainly on her palms. Informed patient to take benadryl. Patient denied any anaphylaxis symptoms. Patient is wondering what else she can do to help with the itching and bumps. Please advise.

## 2018-12-11 NOTE — Telephone Encounter (Signed)
Called and informed patient of this recommendation. Patient stated that she did end up using her Epi pen. I asked if she seeked medical attention after, she stated no that she just sat there for a little bit. I did informed patient that it is recommended that she seek medical attention since she used her epi pen. Patient stated she was at the pharmacy at the moment. Picking up some cream and would pick up prescription.

## 2018-12-13 NOTE — Telephone Encounter (Signed)
Courtesy call to see how patient was doing since injecting herself with Epi-pen.

## 2018-12-14 NOTE — Telephone Encounter (Signed)
Called patient and she stated she is doing much better. She stated the prednisone help out a lot. I did ask her if she had any problems with any of her previous injections and she stated yes. She stated sometimes she gets bumps at injection site 2+ and 3+. Patient is on schedule B right now, would you like her to switch to schedule A? I did inform patient that we recommend her to stay in clinic after her injection due to her reaction and her leaving without waiting. She appreciated the follow up call and helping her during her reactions.

## 2018-12-14 NOTE — Telephone Encounter (Signed)
This is a Dr. Darnell Level immunotherapy patient whom I have never seen. Will route to him.

## 2018-12-14 NOTE — Telephone Encounter (Signed)
Reviewed notes and appreciate the care provided while I was out of the office. Let's change to Schedule A and make sure that she is only coming in once weekly (I think she is doing that anyway). Let's back her down to 0.2 mL of each as well.   Salvatore Marvel, MD [facility]

## 2018-12-15 NOTE — Telephone Encounter (Signed)
Noted. Flowsheet has been updated to reflect this change.

## 2018-12-22 ENCOUNTER — Ambulatory Visit (INDEPENDENT_AMBULATORY_CARE_PROVIDER_SITE_OTHER): Payer: Managed Care, Other (non HMO) | Admitting: *Deleted

## 2018-12-22 DIAGNOSIS — J309 Allergic rhinitis, unspecified: Secondary | ICD-10-CM

## 2019-01-05 ENCOUNTER — Ambulatory Visit (INDEPENDENT_AMBULATORY_CARE_PROVIDER_SITE_OTHER): Payer: Managed Care, Other (non HMO)

## 2019-01-05 DIAGNOSIS — J309 Allergic rhinitis, unspecified: Secondary | ICD-10-CM | POA: Diagnosis not present

## 2019-01-12 ENCOUNTER — Ambulatory Visit (INDEPENDENT_AMBULATORY_CARE_PROVIDER_SITE_OTHER): Payer: Managed Care, Other (non HMO)

## 2019-01-12 DIAGNOSIS — J309 Allergic rhinitis, unspecified: Secondary | ICD-10-CM | POA: Diagnosis not present

## 2019-01-19 LAB — HM COLONOSCOPY

## 2019-01-22 ENCOUNTER — Encounter: Payer: Self-pay | Admitting: Internal Medicine

## 2019-02-06 ENCOUNTER — Ambulatory Visit (INDEPENDENT_AMBULATORY_CARE_PROVIDER_SITE_OTHER): Payer: Managed Care, Other (non HMO)

## 2019-02-06 DIAGNOSIS — J309 Allergic rhinitis, unspecified: Secondary | ICD-10-CM | POA: Diagnosis not present

## 2019-02-07 ENCOUNTER — Encounter: Payer: Self-pay | Admitting: Internal Medicine

## 2019-02-16 ENCOUNTER — Encounter: Payer: Self-pay | Admitting: Internal Medicine

## 2019-02-22 ENCOUNTER — Encounter: Payer: Self-pay | Admitting: Internal Medicine

## 2019-02-23 ENCOUNTER — Other Ambulatory Visit: Payer: Self-pay

## 2019-02-23 MED ORDER — ALBUTEROL SULFATE HFA 108 (90 BASE) MCG/ACT IN AERS: 2.0000 | INHALATION_SPRAY | Freq: Four times a day (QID) | RESPIRATORY_TRACT | 1 refills | Status: DC | PRN

## 2019-02-27 ENCOUNTER — Ambulatory Visit: Payer: Managed Care, Other (non HMO) | Admitting: Adult Health

## 2019-03-05 ENCOUNTER — Ambulatory Visit: Payer: Self-pay | Admitting: Nurse Practitioner

## 2019-03-27 ENCOUNTER — Other Ambulatory Visit: Payer: Self-pay

## 2019-03-27 ENCOUNTER — Telehealth (INDEPENDENT_AMBULATORY_CARE_PROVIDER_SITE_OTHER): Payer: 59 | Admitting: Nurse Practitioner

## 2019-03-27 DIAGNOSIS — J3489 Other specified disorders of nose and nasal sinuses: Secondary | ICD-10-CM | POA: Diagnosis not present

## 2019-03-27 DIAGNOSIS — R059 Cough, unspecified: Secondary | ICD-10-CM

## 2019-03-27 DIAGNOSIS — Z634 Disappearance and death of family member: Secondary | ICD-10-CM

## 2019-03-27 DIAGNOSIS — F419 Anxiety disorder, unspecified: Secondary | ICD-10-CM

## 2019-03-27 DIAGNOSIS — R05 Cough: Secondary | ICD-10-CM | POA: Diagnosis not present

## 2019-03-27 DIAGNOSIS — Z1152 Encounter for screening for COVID-19: Secondary | ICD-10-CM

## 2019-03-27 DIAGNOSIS — R062 Wheezing: Secondary | ICD-10-CM

## 2019-03-27 DIAGNOSIS — Z7189 Other specified counseling: Secondary | ICD-10-CM

## 2019-03-27 DIAGNOSIS — Z8709 Personal history of other diseases of the respiratory system: Secondary | ICD-10-CM

## 2019-03-27 MED ORDER — ALBUTEROL SULFATE (2.5 MG/3ML) 0.083% IN NEBU: 2.5000 mg | INHALATION_SOLUTION | Freq: Four times a day (QID) | RESPIRATORY_TRACT | 2 refills | Status: DC | PRN

## 2019-03-27 MED ORDER — AMOXICILLIN-POT CLAVULANATE 875-125 MG PO TABS: 1.0000 | ORAL_TABLET | Freq: Two times a day (BID) | ORAL | 0 refills | Status: AC

## 2019-03-27 MED ORDER — ALPRAZOLAM 0.25 MG PO TABS: 0.2500 mg | ORAL_TABLET | Freq: Three times a day (TID) | ORAL | 0 refills | Status: DC | PRN

## 2019-03-27 NOTE — Progress Notes (Addendum)
Virtual Visit via Failed Video   This visit type was conducted due to national recommendations for restrictions regarding the COVID-19 Pandemic (e.g. social distancing) in an effort to limit this patient's exposure and mitigate transmission in our community.  Due to her co-morbid illnesses, this patient is at least at moderate risk for complications without adequate follow up.  This format is felt to be most appropriate for this patient at this time.  All issues noted in this document were discussed and addressed.  A limited physical exam was performed with this format.    This visit type was conducted due to national recommendations for restrictions regarding the COVID-19 Pandemic (e.g. social distancing) in an effort to limit this patient's exposure and mitigate transmission in our community.  Patients identity confirmed using two different identifiers.  This format is felt to be most appropriate for this patient at this time.  All issues noted in this document were discussed and addressed.  No physical exam was performed (except for noted visual exam findings with Video Visits).    Date:  03/28/2019   ID:  Kayla [name], DOB Aug 13, 1965, MRN [id number]  Patient Location:  Home - spoke with Kayla [name]  Provider location:   Office    Chief Complaint:  Sinus pressure and depression after loss of her sister  History of Present Illness:    Kayla [name] is a 54 y.o. female who presents via video conferencing for a telehealth visit today.    The patient does have symptoms concerning for COVID-19 infection (fever, chills, cough, or new shortness of breath).   She was off Christmas Eve and has not been to work due to her sister dying on Christmas day. She drove home from [place] on Saturday.  She feels like she has a sinus infection is irritated. She still has a sense of taste and smell.  She has a decreased appetite.  Had a headache yesterday.    Sinus Problem This is a new  problem. The current episode started in the past 7 days (saturday). There has been no fever. She is experiencing no pain. Associated symptoms include a sore throat. Pertinent negatives include no chills, congestion, coughing, headaches, shortness of breath (feels like she has wheezing in her chest) or sneezing.  Depression        The patient presents with no depression.  This is a new (sudden onset) problem.  The current episode started in the past 7 days.   The onset quality is sudden.   Associated symptoms include myalgias.  Associated symptoms include no headaches.   Pertinent negatives include no hypothyroidism, no thyroid problem, no anxiety and no depression.    Past Medical History:  Diagnosis Date  . Arthritis   . Chronic back pain   . Chronic neck pain   . Chronic shoulder pain    left  . Knee pain   . Migraine   . Sciatica    Past Surgical History:  Procedure Laterality Date  . CESAREAN SECTION    . FOOT SURGERY Left 2018  . LESION EXCISION Right    X3  . SHOULDER SURGERY Left 2015  . TUBAL LIGATION       Current Meds  Medication Sig  . albuterol (VENTOLIN HFA) 108 (90 Base) MCG/ACT inhaler Inhale 2 puffs into the lungs every 6 (six) hours as needed for wheezing or shortness of breath.  Marland Kitchen azelastine (ASTELIN) 0.1 % nasal spray Place 2 sprays into both nostrils 2 (  two) times daily.  . cetirizine (ZYRTEC) 10 MG tablet Take 1 tablet (10 mg total) by mouth daily.  Marland Kitchen desonide (DESOWEN) 0.05 % cream Apply topically 2 (two) times daily. Use as needed  . levocetirizine (XYZAL) 5 MG tablet Take 1 tablet (5 mg total) by mouth every evening.  . Linaclotide (LINZESS) 290 MCG CAPS capsule Take 290 mcg by mouth daily.  . montelukast (SINGULAIR) 10 MG tablet Take 1 tablet (10 mg total) by mouth at bedtime.  Marland Kitchen olopatadine (PATANOL) 0.1 % ophthalmic solution Place 1 drop into both eyes 2 (two) times daily.  . ondansetron (ZOFRAN) 8 MG tablet Take 4 mg by mouth every 8 (eight) hours as  needed for nausea or vomiting.  . triamcinolone (NASACORT) 55 MCG/ACT AERO nasal inhaler Place 2 sprays into the nose daily.  . Vilazodone HCl (VIIBRYD) 40 MG TABS Take 1 tablet (40 mg total) by mouth daily.     Allergies:   Codeine   Social History   Tobacco Use  . Smoking status: Light Tobacco Smoker    Packs/day: 0.25    Years: 34.00    Pack years: 8.50    Types: Cigarettes  . Smokeless tobacco: Never Used  Substance Use Topics  . Alcohol use: Yes    Alcohol/week: 1.0 standard drinks    Types: 1 Glasses of wine per week    Comment: occasionally  . Drug use: No    Types: Marijuana    Comment: former user     Family Hx: The patient's family history includes Cancer in her father and mother.  ROS:   Please see the history of present illness.    Review of Systems  Constitutional: Negative for chills.  HENT: Positive for sinus pain and sore throat. Negative for congestion and sneezing.   Respiratory: Negative for cough and shortness of breath (feels like she has wheezing in her chest).   Cardiovascular: Negative for chest pain.  Musculoskeletal: Positive for myalgias.  Neurological: Negative for dizziness and headaches.  Psychiatric/Behavioral: Positive for depression.    All other systems reviewed and are negative.   Labs/Other Tests and Data Reviewed:    Recent Labs: 05/25/2018: ALT 13; TSH 0.535 12/04/2018: BUN 12; Creatinine, Ser 0.84; Hemoglobin 13.7; Platelets 290; Potassium 4.4; Sodium 142   Recent Lipid Panel Lab Results  Component Value Date/Time   CHOL 226 (H) 12/04/2018 10:31 AM   TRIG 83 12/04/2018 10:31 AM   HDL 84 12/04/2018 10:31 AM   CHOLHDL 2.7 12/04/2018 10:31 AM   LDLCALC 128 (H) 12/04/2018 10:31 AM    Wt Readings from Last 3 Encounters:  12/04/18 218 lb 9.6 oz (99.2 kg)  10/12/18 225 lb 3.2 oz (102.2 kg)  10/10/18 225 lb (102.1 kg)     Exam:    Vital Signs:  LMP  (LMP Unknown)     Physical Exam  Constitutional: She is oriented to  person, place, and time. No distress.  Neurological: She is alert and oriented to person, place, and time.  Psychiatric: Mood, memory, affect and judgment normal.    ASSESSMENT & PLAN:    1. Cough  New onset cough feels is similar to her bronchitis episodes  Since she has been out of town will have her tested for covid and to quarantine until her results - albuterol (PROVENTIL) (2.5 MG/3ML) 0.083% nebulizer solution; Take 3 mLs (2.5 mg total) by nebulization every 6 (six) hours as needed for wheezing or shortness of breath.  Dispense: 75 mL; Refill: 2  2. Wheezing  History of asthma, telephone visit so unable to auscultate  She can use her neb as needed  If worsens or shortness of breath she is to go to ER - albuterol (PROVENTIL) (2.5 MG/3ML) 0.083% nebulizer solution; Take 3 mLs (2.5 mg total) by nebulization every 6 (six) hours as needed for wheezing or shortness of breath.  Dispense: 75 mL; Refill: 2  3. Anxiety  Worse since her sister passed, will provide xanax as needed - ALPRAZolam (XANAX) 0.25 MG tablet; Take 1 tablet (0.25 mg total) by mouth 3 (three) times daily as needed for anxiety.  Dispense: 30 tablet; Refill: 0 - Ambulatory referral to Psychology  4. Sinus pressure  Due to history will treat with augment and she is to go for covid testing  Continue with allergy medication - amoxicillin-clavulanate (AUGMENTIN) 875-125 MG tablet; Take 1 tablet by mouth 2 (two) times daily for 7 days.  Dispense: 14 tablet; Refill: 0  5. Grief counseling  Requesting to be seen again by [facility] - Ambulatory referral to Psychology   COVID-19 Education: The signs and symptoms of COVID-19 were discussed with the patient and how to seek care for testing (follow up with PCP or arrange E-visit).  The importance of social distancing was discussed today.  Patient Risk:   After full review of this patients clinical status, I feel that they are at least moderate risk at this  time.  Time:   Today, I have spent 18 minutes/ seconds with the patient with telehealth technology discussing above diagnoses.     Medication Adjustments/Labs and Tests Ordered: Current medicines are reviewed at length with the patient today.  Concerns regarding medicines are outlined above.   Tests Ordered: Orders Placed This Encounter  Procedures  . Ambulatory referral to Psychology    Medication Changes: Meds ordered this encounter  Medications  . albuterol (PROVENTIL) (2.5 MG/3ML) 0.083% nebulizer solution    Sig: Take 3 mLs (2.5 mg total) by nebulization every 6 (six) hours as needed for wheezing or shortness of breath.    Dispense:  75 mL    Refill:  2  . ALPRAZolam (XANAX) 0.25 MG tablet    Sig: Take 1 tablet (0.25 mg total) by mouth 3 (three) times daily as needed for anxiety.    Dispense:  30 tablet    Refill:  0  . amoxicillin-clavulanate (AUGMENTIN) 875-125 MG tablet    Sig: Take 1 tablet by mouth 2 (two) times daily for 7 days.    Dispense:  14 tablet    Refill:  0    Disposition:  Follow up prn  Signed, Minette Brine, FNP

## 2019-03-29 ENCOUNTER — Ambulatory Visit: Payer: 59 | Attending: Internal Medicine

## 2019-03-29 ENCOUNTER — Encounter: Payer: Self-pay | Admitting: Internal Medicine

## 2019-03-29 DIAGNOSIS — Z20822 Contact with and (suspected) exposure to covid-19: Secondary | ICD-10-CM

## 2019-03-30 LAB — NOVEL CORONAVIRUS, NAA: SARS-CoV-2, NAA: NOT DETECTED

## 2019-04-03 ENCOUNTER — Other Ambulatory Visit: Payer: Self-pay

## 2019-04-03 ENCOUNTER — Encounter: Payer: Self-pay | Admitting: Nurse Practitioner

## 2019-04-03 ENCOUNTER — Ambulatory Visit (INDEPENDENT_AMBULATORY_CARE_PROVIDER_SITE_OTHER): Payer: 59 | Admitting: Nurse Practitioner

## 2019-04-03 VITALS — BP 110/80 | HR 73 | Temp 98.2°F | Ht 66.8 in | Wt 215.0 lb

## 2019-04-03 DIAGNOSIS — J209 Acute bronchitis, unspecified: Secondary | ICD-10-CM

## 2019-04-03 DIAGNOSIS — F4321 Adjustment disorder with depressed mood: Secondary | ICD-10-CM | POA: Diagnosis not present

## 2019-04-03 DIAGNOSIS — Z634 Disappearance and death of family member: Secondary | ICD-10-CM

## 2019-04-03 DIAGNOSIS — F331 Major depressive disorder, recurrent, moderate: Secondary | ICD-10-CM

## 2019-04-03 MED ORDER — TRIAMCINOLONE ACETONIDE 40 MG/ML IJ SUSP
60.0000 mg | Freq: Once | INTRAMUSCULAR | Status: AC
  Administered 2019-04-03: 60 mg via INTRAMUSCULAR

## 2019-04-03 MED ORDER — CEFTRIAXONE SODIUM 500 MG IJ SOLR
500.0000 mg | Freq: Once | INTRAMUSCULAR | Status: AC
  Administered 2019-04-03: 500 mg via INTRAMUSCULAR

## 2019-04-03 MED ORDER — BENZONATATE 100 MG PO CAPS: 100.0000 mg | ORAL_CAPSULE | Freq: Four times a day (QID) | ORAL | 1 refills | Status: DC | PRN

## 2019-04-03 NOTE — Patient Instructions (Signed)

## 2019-04-03 NOTE — Progress Notes (Signed)
This visit occurred during the SARS-CoV-2 public health emergency.  Safety protocols were in place, including screening questions prior to the visit, additional usage of staff PPE, and extensive cleaning of exam room while observing appropriate contact time as indicated for disinfecting solutions.  Subjective:     Patient ID: Kayla [name] , female    DOB: 1966-02-19 , 54 y.o.   MRN: [id number]   Chief Complaint  Patient presents with  . Bronchitis  . Depression    HPI  She received her nebulizer 2-3 years ago with the same tubing. She will use her albuterol neb once and if not feeling better will do a second time within a short period of time.       Depression        This is a new problem.  The current episode started 1 to 4 weeks ago (worse since the death of her sister).   The onset quality is sudden.   The problem has been gradually worsening since onset.  Associated symptoms include fatigue and sad.  Associated symptoms include no headaches.  Risk factors include major life event (death of her sister).  URI  This is a new problem. The current episode started 1 to 4 weeks ago. There has been no fever. Associated symptoms include coughing and ear pain (right ear pain). Pertinent negatives include no abdominal pain, congestion, headaches, sinus pain, sneezing or sore throat. She has tried nothing for the symptoms.     Past Medical History:  Diagnosis Date  . Arthritis   . Chronic back pain   . Chronic neck pain   . Chronic shoulder pain    left  . Knee pain   . Migraine   . Sciatica      Family History  Problem Relation Age of Onset  . Cancer Mother   . Cancer Father      Current Outpatient Medications:  .  albuterol (PROVENTIL) (2.5 MG/3ML) 0.083% nebulizer solution, Take 3 mLs (2.5 mg total) by nebulization every 6 (six) hours as needed for wheezing or shortness of breath., Disp: 75 mL, Rfl: 2 .  albuterol (VENTOLIN HFA) 108 (90 Base) MCG/ACT inhaler, Inhale 2  puffs into the lungs every 6 (six) hours as needed for wheezing or shortness of breath., Disp: 18 g, Rfl: 1 .  ALPRAZolam (XANAX) 0.25 MG tablet, Take 1 tablet (0.25 mg total) by mouth 3 (three) times daily as needed for anxiety., Disp: 30 tablet, Rfl: 0 .  amoxicillin-clavulanate (AUGMENTIN) 875-125 MG tablet, Take 1 tablet by mouth 2 (two) times daily for 7 days., Disp: 14 tablet, Rfl: 0 .  azelastine (ASTELIN) 0.1 % nasal spray, Place 2 sprays into both nostrils 2 (two) times daily., Disp: 30 mL, Rfl: 5 .  cetirizine (ZYRTEC) 10 MG tablet, Take 1 tablet (10 mg total) by mouth daily., Disp: 30 tablet, Rfl: 5 .  desonide (DESOWEN) 0.05 % cream, Apply topically 2 (two) times daily. Use as needed, Disp: 30 g, Rfl: 0 .  levocetirizine (XYZAL) 5 MG tablet, Take 1 tablet (5 mg total) by mouth every evening., Disp: 30 tablet, Rfl: 5 .  Linaclotide (LINZESS) 290 MCG CAPS capsule, Take 290 mcg by mouth daily., Disp: , Rfl:  .  montelukast (SINGULAIR) 10 MG tablet, Take 1 tablet (10 mg total) by mouth at bedtime., Disp: 30 tablet, Rfl: 5 .  olopatadine (PATANOL) 0.1 % ophthalmic solution, Place 1 drop into both eyes 2 (two) times daily., Disp: 5 mL, Rfl: 1 .  ondansetron (ZOFRAN) 8 MG tablet, Take 4 mg by mouth every 8 (eight) hours as needed for nausea or vomiting., Disp: , Rfl:  .  triamcinolone (NASACORT) 55 MCG/ACT AERO nasal inhaler, Place 2 sprays into the nose daily., Disp: 1 Bottle, Rfl: 5 .  Vilazodone HCl (VIIBRYD) 40 MG TABS, Take 1 tablet (40 mg total) by mouth daily., Disp: 30 tablet, Rfl: 1 .  predniSONE (DELTASONE) 10 MG tablet, Take one tablet twice a day for four day, then take one tablet on fifth day, then stop (Patient not taking: Reported on 03/27/2019), Disp: 9 tablet, Rfl: 0   Allergies  Allergen Reactions  . Codeine Nausea And Vomiting     Review of Systems  Constitutional: Positive for fatigue.  HENT: Positive for ear pain (right ear pain). Negative for congestion, sinus pain,  sneezing and sore throat.   Respiratory: Positive for cough.   Gastrointestinal: Negative for abdominal distention and abdominal pain.  Neurological: Negative for dizziness and headaches.  Psychiatric/Behavioral: Positive for depression.       Sad     Today's Vitals   04/03/19 1503  BP: 110/80  Pulse: 73  Temp: 98.2 F (36.8 C)  TempSrc: Oral  Weight: 215 lb (97.5 kg)  Height: 5' 6.8" (1.697 m)  PainSc: 0-No pain   Body mass index is 33.88 kg/m.   Objective:  Physical Exam Constitutional:      Appearance: Normal appearance.  Cardiovascular:     Rate and Rhythm: Normal rate and regular rhythm.     Pulses: Normal pulses.     Heart sounds: Normal heart sounds.  Pulmonary:     Effort: Pulmonary effort is normal. No respiratory distress.     Breath sounds: Normal breath sounds.  Skin:    Capillary Refill: Capillary refill takes less than 2 seconds.  Neurological:     General: No focal deficit present.     Mental Status: She is alert and oriented to person, place, and time.     Cranial Nerves: No cranial nerve deficit.  Psychiatric:        Mood and Affect: Mood normal.        Behavior: Behavior normal.        Thought Content: Thought content normal.        Judgment: Judgment normal.     Comments: At times has a flat affect and slightly tearful         Assessment And Plan:     1. Acute bronchitis, unspecified organism  She is not feeling any better will treat with kenalog due to using her albuterol multiple times a day without relief - triamcinolone acetonide (KENALOG-40) injection 60 mg - cefTRIAXone (ROCEPHIN) injection 500 mg - benzonatate (TESSALON PERLES) 100 MG capsule; Take 1 capsule (100 mg total) by mouth every 6 (six) hours as needed.  Dispense: 30 capsule; Refill: 1  2. Moderate episode of recurrent major depressive disorder ([place])  This has worsened since the death of her sister   3. Grief  She is to see Counselor Funderburk this week    Minette Brine, FNP    THE PATIENT IS ENCOURAGED TO PRACTICE SOCIAL DISTANCING DUE TO THE COVID-19 PANDEMIC.

## 2019-04-10 ENCOUNTER — Other Ambulatory Visit: Payer: Self-pay | Admitting: Internal Medicine

## 2019-04-12 ENCOUNTER — Ambulatory Visit: Payer: Managed Care, Other (non HMO) | Admitting: Allergy & Immunology

## 2019-04-16 ENCOUNTER — Encounter: Payer: Self-pay | Admitting: Nurse Practitioner

## 2019-04-19 ENCOUNTER — Other Ambulatory Visit: Payer: Self-pay

## 2019-04-19 ENCOUNTER — Ambulatory Visit: Payer: 59 | Admitting: Allergy & Immunology

## 2019-04-19 ENCOUNTER — Encounter: Payer: Self-pay | Admitting: Allergy & Immunology

## 2019-04-19 VITALS — BP 120/76 | HR 71 | Temp 97.4°F | Resp 18

## 2019-04-19 DIAGNOSIS — J302 Other seasonal allergic rhinitis: Secondary | ICD-10-CM | POA: Diagnosis not present

## 2019-04-19 DIAGNOSIS — T781XXD Other adverse food reactions, not elsewhere classified, subsequent encounter: Secondary | ICD-10-CM

## 2019-04-19 DIAGNOSIS — J3089 Other allergic rhinitis: Secondary | ICD-10-CM | POA: Diagnosis not present

## 2019-04-19 MED ORDER — AZELASTINE HCL 0.1 % NA SOLN: 2.0000 | Freq: Two times a day (BID) | NASAL | 5 refills | Status: DC

## 2019-04-19 MED ORDER — CETIRIZINE HCL 10 MG PO TABS: 10.0000 mg | ORAL_TABLET | Freq: Every day | ORAL | 5 refills | Status: DC

## 2019-04-19 MED ORDER — TRIAMCINOLONE ACETONIDE 55 MCG/ACT NA AERO: 2.0000 | INHALATION_SPRAY | Freq: Every day | NASAL | 5 refills | Status: AC

## 2019-04-19 MED ORDER — OLOPATADINE HCL 0.1 % OP SOLN: 1.0000 [drp] | Freq: Two times a day (BID) | OPHTHALMIC | 5 refills | Status: AC

## 2019-04-19 MED ORDER — EPINEPHRINE 0.3 MG/0.3ML IJ SOAJ: 0.3000 mg | Freq: Once | INTRAMUSCULAR | 1 refills | Status: AC

## 2019-04-19 NOTE — Patient Instructions (Addendum)
1. Seasonal and perennial allergic rhinitis (grasses, ragweed, weeds, trees, indoor molds, outdoor molds, dust mites, cat and dog) - We are going to remix your vials into three vials to divide the allergens up.  - Continue with: Zyrtec (cetirizine) 10mg  tablet once daily, Nasacort (triamcinolone) two sprays per nostril daily and Astelin (azelastine) 2 sprays per nostril 1-2 times daily as needed  2. Adverse food  (tree nuts) - Testing was negative to everything tested today. - EpiPen refilled today.  3. Return in about 1 year (around 04/18/2020). This can be an in-person, a virtual Webex or a telephone follow up visit.   Please inform us of any Emergency Department visits, hospitalizations, or changes in symptoms. Call us before going to the ED for breathing or allergy symptoms since we might be able to fit you in for a sick visit. Feel free to contact us anytime with any questions, problems, or concerns.  It was a pleasure to see you again today!  Websites that have reliable patient information: 1. American Academy of Asthma, Allergy, and Immunology: www.aaaai.org 2. Food Allergy Research and Education (FARE): foodallergy.org 3. Mothers of Asthmatics: http://www.asthmacommunitynetwork.org 4. American College of Allergy, Asthma, and Immunology: www.acaai.org   COVID-19 Vaccine Information can be found at: ShippingScam.co.uk For questions related to vaccine distribution or appointments, please email vaccine@[email or web address] .com or call [phone].     "Like" [place] on Facebook and Instagram for our latest updates!        Make sure you are registered to vote! If you have moved or changed any of your contact information, you will need to get this updated before voting!  In some cases, you MAY be able to register to vote online: [email or web address]

## 2019-04-19 NOTE — Progress Notes (Addendum)
FOLLOW UP  Date of Service/Encounter:  04/19/19   Assessment:   Seasonal and perennial allergic rhinitis(grasses, ragweed, weeds, trees, indoor molds, outdoor molds, dust mites, cat and dog)  History of large local reactions with anaphylaxis  Adverse food reactions (tree nuts, wheat, grape) - with testing only slightly positive to tree nuts on blood work  Recurrent bronchitis - isolated to the winter months only (consider addition of a controller in the future)  Plan/Recommendations:   1. Seasonal and perennial allergic rhinitis (grasses, ragweed, weeds, trees, indoor molds, outdoor molds, dust mites, cat and dog) - We are going to remix your vials into three vials to divide the allergens up. - This will help to decrease the chances of reactions, which has been limiting your advance in the past.  - We were getting to the point when they needed to be re-mixed anyway for a new set of vials.  - Continue with: Zyrtec (cetirizine) 10mg  tablet once daily, Nasacort (triamcinolone) two sprays per nostril daily and Astelin (azelastine) 2 sprays per nostril 1-2 times daily as needed  2. Adverse food  (tree nuts) - Testing has been negative in the past.  - EpiPen refilled today.   3. Return in about 1 year (around 04/18/2020). This can be an in-person, a virtual Webex or a telephone follow up visit.  Subjective:   Kayla [name] is a 54 y.o. female presenting today for follow up of  Chief Complaint  Patient presents with  . Follow-up    Kayla [name] has a history of the following: Patient Active Problem List   Diagnosis Date Noted  . Grief 04/03/2019  . Moderate episode of recurrent major depressive disorder ([place]) 10/12/2018  . Adverse food reaction 06/29/2018  . Seasonal and perennial allergic rhinitis 06/29/2018  . Class 2 obesity due to excess calories without serious comorbidity with body mass index (BMI) of 37.0 to 37.9 in adult 04/28/2017  . Excessive daytime  sleepiness 04/28/2017  . 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 Sleep-related headache 04/28/2017  . GERD with apnea 04/28/2017    History obtained from: chart review and patient.  Kayla [name] is a 54 y.o. female presenting for a follow up visit.  She was last seen in July 2020.  At that time, we continued with allergy shots as well as Zyrtec, Singulair, Nasacort, and Astelin.  We made sure her Auvi-Q was up-to-date.  Since the last visit, she has done well. She was tested for COVID19 in mid January because of exposures. But she was negative. She has had bronchitis since her last injection. She went traveling to [place] and must have caught something then. She is using an albuterol inhaler and nebulizer. She gets this once per year but it does not typically last this long. This starts as a bad cough and then "morphed" into something "ugly". She did receive a steroid injection. Right now, she is weaning off of her albuterol over the last few days. She has never been on a daily controller medication.   Allergic Rhinitis Symptom History: She did quit her allergy shots because she was having so many reactions. She only got into the [employer]. One of the episodes included her administering epinephrine in the field.  She did come back after injecting herself and we did give her prednisone and watched her for a bit.  She did feel that the addition of the allergy shots did help her symptoms over the course of 2020.  She is open  to trying these again.  Her vials are expiring in April anyway, so it is getting close to a point where we need to remix them anyway.  She thinks that the arm that had the large local reactions was the vial containing pollens.  However, according to the flow chart, she left without being seen for the majority of her allergy shots.  Therefore, it is difficult to ascertain which arm with the most reactive.  Food Allergy Symptom History: She continues to avoid tree nuts.  She has had no  accidental exposures at all.  Her epinephrine autoinjector does need to be refilled.  Otherwise, there have been no changes to her past medical history, surgical history, family history, or social history.    Review of Systems  Constitutional: Negative.  Negative for chills, fever, malaise/fatigue and weight loss.  HENT: Negative.  Negative for congestion, ear discharge, ear pain, sinus pain and sore throat.   Eyes: Negative for pain, discharge and redness.  Respiratory: Positive for cough, sputum production, shortness of breath and wheezing.   Cardiovascular: Negative.  Negative for chest pain and palpitations.  Gastrointestinal: Negative for abdominal pain, constipation, diarrhea, heartburn, nausea and vomiting.  Skin: Negative.  Negative for itching and rash.  Neurological: Negative for dizziness and headaches.  Endo/Heme/Allergies: Negative for environmental allergies. Does not bruise/bleed easily.       Objective:   Blood pressure 120/76, pulse 71, temperature (!) 97.4 F (36.3 C), temperature source Temporal, resp. rate 18, SpO2 99 %. There is no height or weight on file to calculate BMI.   Physical Exam:  Physical Exam  Constitutional: She appears well-developed.  Smiling and laughing.  Very personable.  HENT:  Head: Normocephalic and atraumatic.  Right Ear: Tympanic membrane, external ear and ear canal normal.  Left Ear: Tympanic membrane, external ear and ear canal normal.  Nose: Mucosal edema and rhinorrhea present. No nasal deformity or septal deviation. No epistaxis. Right sinus exhibits no maxillary sinus tenderness and no frontal sinus tenderness. Left sinus exhibits no maxillary sinus tenderness and no frontal sinus tenderness.  Mouth/Throat: Uvula is midline and oropharynx is clear and moist. Mucous membranes are not pale and not dry.  Turbinates are edematous with clear discharge.  There is moderate cobblestoning in the posterior oropharynx.  Eyes: Pupils are  equal, round, and reactive to light. Conjunctivae and EOM are normal. Right eye exhibits no chemosis and no discharge. Left eye exhibits no chemosis and no discharge. Right conjunctiva is not injected. Left conjunctiva is not injected.  Cardiovascular: Normal rate, regular rhythm and normal heart sounds.  Respiratory: Effort normal and breath sounds normal. No accessory muscle usage. No tachypnea. No respiratory distress. She has no wheezes. She has no rhonchi. She has no rales. She exhibits no tenderness.  Moving air well in all lung fields.  No increased work of breathing.  Lymphadenopathy:    She has no cervical adenopathy.  Neurological: She is alert.  Skin: No abrasion, no petechiae and no rash noted. Rash is not papular, not vesicular and not urticarial. No erythema. No pallor.  Psychiatric: She has a normal mood and affect.     Diagnostic studies: none     Salvatore Marvel, MD  [facility]

## 2019-04-23 DIAGNOSIS — J301 Allergic rhinitis due to pollen: Secondary | ICD-10-CM | POA: Diagnosis not present

## 2019-04-23 NOTE — Progress Notes (Signed)
NEW VIAL RX.  EXP 04-22-20

## 2019-04-24 DIAGNOSIS — J301 Allergic rhinitis due to pollen: Secondary | ICD-10-CM | POA: Diagnosis not present

## 2019-04-25 DIAGNOSIS — J3089 Other allergic rhinitis: Secondary | ICD-10-CM | POA: Diagnosis not present

## 2019-05-17 ENCOUNTER — Ambulatory Visit (INDEPENDENT_AMBULATORY_CARE_PROVIDER_SITE_OTHER): Payer: 59

## 2019-05-17 ENCOUNTER — Other Ambulatory Visit: Payer: Self-pay

## 2019-05-17 DIAGNOSIS — J309 Allergic rhinitis, unspecified: Secondary | ICD-10-CM | POA: Diagnosis not present

## 2019-05-17 NOTE — Progress Notes (Signed)
Immunotherapy   Patient Details  Name: Kayla [name] MRN: [id number] Date of Birth: Jul 27, 1965  05/17/2019  Patient restarted allergy injections today. Patient received .70ml from Blue 1:100,000 vial G-RW-T, .55ml from Blue 1:100,000 vial C-D  and .35ml from Blue 1:100,000 MOLDS-DM. Patient will follow Schedule A and receive injections once weekly. Consent form on file. Patient has an Epipen. Patient waited 30 minutes prior to leaving. No adverse findings noted.    Cathi Roan 05/17/2019, 5:05 PM

## 2019-05-25 ENCOUNTER — Ambulatory Visit (INDEPENDENT_AMBULATORY_CARE_PROVIDER_SITE_OTHER): Payer: 59

## 2019-05-25 DIAGNOSIS — J3089 Other allergic rhinitis: Secondary | ICD-10-CM | POA: Diagnosis not present

## 2019-05-25 DIAGNOSIS — J302 Other seasonal allergic rhinitis: Secondary | ICD-10-CM | POA: Diagnosis not present

## 2019-05-31 ENCOUNTER — Ambulatory Visit: Payer: 59 | Admitting: Allergy & Immunology

## 2019-06-04 ENCOUNTER — Encounter: Payer: Self-pay | Admitting: Internal Medicine

## 2019-06-04 ENCOUNTER — Ambulatory Visit: Payer: 59 | Admitting: Internal Medicine

## 2019-06-19 ENCOUNTER — Ambulatory Visit: Payer: 59 | Admitting: Internal Medicine

## 2019-06-25 ENCOUNTER — Other Ambulatory Visit: Payer: Self-pay

## 2019-06-25 ENCOUNTER — Ambulatory Visit: Payer: 59 | Admitting: Internal Medicine

## 2019-06-25 ENCOUNTER — Encounter: Payer: Self-pay | Admitting: Internal Medicine

## 2019-06-25 VITALS — BP 120/64 | HR 62 | Temp 98.0°F | Ht 66.8 in | Wt 205.0 lb

## 2019-06-25 DIAGNOSIS — J3089 Other allergic rhinitis: Secondary | ICD-10-CM

## 2019-06-25 DIAGNOSIS — J302 Other seasonal allergic rhinitis: Secondary | ICD-10-CM | POA: Diagnosis not present

## 2019-06-25 DIAGNOSIS — F331 Major depressive disorder, recurrent, moderate: Secondary | ICD-10-CM | POA: Diagnosis not present

## 2019-06-25 DIAGNOSIS — Z6832 Body mass index (BMI) 32.0-32.9, adult: Secondary | ICD-10-CM

## 2019-06-25 DIAGNOSIS — E6609 Other obesity due to excess calories: Secondary | ICD-10-CM

## 2019-06-25 DIAGNOSIS — E559 Vitamin D deficiency, unspecified: Secondary | ICD-10-CM | POA: Diagnosis not present

## 2019-06-25 MED ORDER — VIIBRYD 20 MG PO TABS: 20.0000 mg | ORAL_TABLET | Freq: Every morning | ORAL | 1 refills | Status: DC

## 2019-06-25 NOTE — Progress Notes (Signed)
This visit occurred during the SARS-CoV-2 public health emergency.  Safety protocols were in place, including screening questions prior to the visit, additional usage of staff PPE, and extensive cleaning of exam room while observing appropriate contact time as indicated for disinfecting solutions.  Subjective:     Patient ID: Kayla [name] , female    DOB: 1965/09/01 , 54 y.o.   MRN: [id number]   Chief Complaint  Patient presents with  . Depression    started viibryd but didnt contimue to take it     HPI  She is here today for f/u depression. She would like to resume Viibryd. She admits that she stopped the medication when she started to feel better. She feels her change in mood has to do with grief. She recently lost an aunt who lived in [place]. In the past year, she lost her cousin. She feels that she would feel better if she resumed the Viibryd.     Past Medical History:  Diagnosis Date  . Arthritis   . Chronic back pain   . Chronic neck pain   . Chronic shoulder pain    left  . Knee pain   . Migraine   . Sciatica      Family History  Problem Relation Age of Onset  . Cancer Mother   . Cancer Father      Current Outpatient Medications:  .  albuterol (PROVENTIL) (2.5 MG/3ML) 0.083% nebulizer solution, Take 3 mLs (2.5 mg total) by nebulization every 6 (six) hours as needed for wheezing or shortness of breath., Disp: 75 mL, Rfl: 2 .  albuterol (VENTOLIN HFA) 108 (90 Base) MCG/ACT inhaler, INHALE 2 PUFFS INTO THE LUNGS EVERY 6 HOURS AS NEEDED FOR WHEEZING OR SHORTNESS OF BREATH, Disp: 54 g, Rfl: 2 .  azelastine (ASTELIN) 0.1 % nasal spray, Place 2 sprays into both nostrils 2 (two) times daily., Disp: 30 mL, Rfl: 5 .  benzonatate (TESSALON PERLES) 100 MG capsule, Take 1 capsule (100 mg total) by mouth every 6 (six) hours as needed., Disp: 30 capsule, Rfl: 1 .  cetirizine (ZYRTEC) 10 MG tablet, Take 1 tablet (10 mg total) by mouth daily., Disp: 30 tablet, Rfl: 5 .   levocetirizine (XYZAL) 5 MG tablet, Take 1 tablet (5 mg total) by mouth every evening., Disp: 30 tablet, Rfl: 5 .  Linaclotide (LINZESS) 290 MCG CAPS capsule, Take 290 mcg by mouth daily., Disp: , Rfl:  .  olopatadine (PATANOL) 0.1 % ophthalmic solution, Place 1 drop into both eyes 2 (two) times daily., Disp: 5 mL, Rfl: 5 .  ondansetron (ZOFRAN) 8 MG tablet, Take 4 mg by mouth every 8 (eight) hours as needed for nausea or vomiting., Disp: , Rfl:  .  triamcinolone (NASACORT) 55 MCG/ACT AERO nasal inhaler, Place 2 sprays into the nose daily., Disp: 16.5 g, Rfl: 5 .  ALPRAZolam (XANAX) 0.25 MG tablet, Take 1 tablet (0.25 mg total) by mouth 3 (three) times daily as needed for anxiety. (Patient not taking: Reported on 06/25/2019), Disp: 30 tablet, Rfl: 0 .  Vilazodone HCl (VIIBRYD) 20 MG TABS, Take 1 tablet (20 mg total) by mouth every morning., Disp: 30 tablet, Rfl: 1 .  Vitamin D, Ergocalciferol, (DRISDOL) 1.25 MG (50000 UNIT) CAPS capsule, One capsules po twice weekly on Tuesdays/Fridays, Disp: 24 capsule, Rfl: 0   Allergies  Allergen Reactions  . Codeine Nausea And Vomiting     Review of Systems  Constitutional: Negative.   HENT: Positive for postnasal drip and rhinorrhea.  Respiratory: Negative.   Cardiovascular: Negative.   Gastrointestinal: Negative.   Neurological: Negative.   Psychiatric/Behavioral: Positive for dysphoric mood.     Today's Vitals   06/25/19 1622  BP: 120/64  Pulse: 62  Temp: 98 F (36.7 C)  TempSrc: Oral  Weight: 205 lb (93 kg)  Height: 5' 6.8" (1.697 m)   Body mass index is 32.3 kg/m.   Wt Readings from Last 3 Encounters:  06/25/19 205 lb (93 kg)  04/03/19 215 lb (97.5 kg)  12/04/18 218 lb 9.6 oz (99.2 kg)     Objective:  Physical Exam Vitals and nursing note reviewed.  Constitutional:      Appearance: Normal appearance. She is obese.  HENT:     Head: Normocephalic and atraumatic.  Cardiovascular:     Rate and Rhythm: Normal rate and regular  rhythm.     Heart sounds: Normal heart sounds.  Pulmonary:     Effort: Pulmonary effort is normal.     Breath sounds: Normal breath sounds.  Skin:    General: Skin is warm.  Neurological:     General: No focal deficit present.     Mental Status: She is alert.  Psychiatric:        Mood and Affect: Mood normal.        Behavior: Behavior normal.         Assessment And Plan:     1. Moderate episode of recurrent major depressive disorder ([place])  I will resume Viibryd 10mg  daily x 7 days, then 20mg  daily. She will rto in six weeks for re-evaluation. I will check thyroid function today.   - TSH  2. Vitamin D deficiency disease  I WILL CHECK A VIT D LEVEL AND SUPPLEMENT AS NEEDED.  ALSO ENCOURAGED TO SPEND 15 MINUTES IN THE SUN DAILY.  - Vitamin D (25 hydroxy)  3. Seasonal and perennial allergic rhinitis  Chronic. She will continue with cetirizine 10mg  daily and azelastine nasal spray.   - CBC no Diff  4. Class 1 obesity due to excess calories with serious comorbidity and body mass index (BMI) of 32.0 to 32.9 in adult  She is encouraged to strive for BMI less than 29 to decrease cardiac risk. Advised to aim for at least 150 minutes of exercise per week.   Maximino Greenland, MD    THE PATIENT IS ENCOURAGED TO PRACTICE SOCIAL DISTANCING DUE TO THE COVID-19 PANDEMIC.

## 2019-06-25 NOTE — Patient Instructions (Signed)

## 2019-06-26 ENCOUNTER — Other Ambulatory Visit: Payer: Self-pay | Admitting: Internal Medicine

## 2019-06-26 ENCOUNTER — Ambulatory Visit (INDEPENDENT_AMBULATORY_CARE_PROVIDER_SITE_OTHER): Payer: 59

## 2019-06-26 DIAGNOSIS — J302 Other seasonal allergic rhinitis: Secondary | ICD-10-CM | POA: Diagnosis not present

## 2019-06-26 DIAGNOSIS — J3089 Other allergic rhinitis: Secondary | ICD-10-CM | POA: Diagnosis not present

## 2019-06-26 LAB — CBC
Hematocrit: 41.3 % (ref 34.0–46.6)
Hemoglobin: 13.6 g/dL (ref 11.1–15.9)
MCH: 26.5 pg — ABNORMAL LOW (ref 26.6–33.0)
MCHC: 32.9 g/dL (ref 31.5–35.7)
MCV: 81 fL (ref 79–97)
Platelets: 279 10*3/uL (ref 150–450)
RBC: 5.13 x10E6/uL (ref 3.77–5.28)
RDW: 14.3 % (ref 11.7–15.4)
WBC: 3.6 10*3/uL (ref 3.4–10.8)

## 2019-06-26 LAB — VITAMIN D 25 HYDROXY (VIT D DEFICIENCY, FRACTURES): Vit D, 25-Hydroxy: 27.1 ng/mL — ABNORMAL LOW (ref 30.0–100.0)

## 2019-06-26 LAB — TSH: TSH: 0.408 u[IU]/mL — ABNORMAL LOW (ref 0.450–4.500)

## 2019-06-26 MED ORDER — VITAMIN D (ERGOCALCIFEROL) 1.25 MG (50000 UNIT) PO CAPS: ORAL_CAPSULE | ORAL | 0 refills | Status: DC

## 2019-06-27 ENCOUNTER — Other Ambulatory Visit: Payer: Self-pay | Admitting: Internal Medicine

## 2019-06-28 ENCOUNTER — Encounter: Payer: Self-pay | Admitting: Internal Medicine

## 2019-07-03 LAB — SPECIMEN STATUS REPORT

## 2019-07-03 LAB — T4, FREE

## 2019-07-04 ENCOUNTER — Other Ambulatory Visit: Payer: Self-pay

## 2019-07-04 ENCOUNTER — Other Ambulatory Visit: Payer: Self-pay | Admitting: Internal Medicine

## 2019-07-04 ENCOUNTER — Other Ambulatory Visit: Payer: 59

## 2019-07-04 DIAGNOSIS — R7989 Other specified abnormal findings of blood chemistry: Secondary | ICD-10-CM

## 2019-07-05 LAB — T4, FREE: Free T4: 1.09 ng/dL (ref 0.82–1.77)

## 2019-07-06 ENCOUNTER — Ambulatory Visit (INDEPENDENT_AMBULATORY_CARE_PROVIDER_SITE_OTHER): Payer: 59

## 2019-07-06 DIAGNOSIS — J309 Allergic rhinitis, unspecified: Secondary | ICD-10-CM | POA: Diagnosis not present

## 2019-07-12 ENCOUNTER — Ambulatory Visit (INDEPENDENT_AMBULATORY_CARE_PROVIDER_SITE_OTHER): Payer: 59

## 2019-07-12 DIAGNOSIS — J309 Allergic rhinitis, unspecified: Secondary | ICD-10-CM | POA: Diagnosis not present

## 2019-07-23 ENCOUNTER — Ambulatory Visit (INDEPENDENT_AMBULATORY_CARE_PROVIDER_SITE_OTHER): Payer: 59

## 2019-07-23 ENCOUNTER — Ambulatory Visit: Payer: 59 | Admitting: Internal Medicine

## 2019-07-23 DIAGNOSIS — J309 Allergic rhinitis, unspecified: Secondary | ICD-10-CM | POA: Diagnosis not present

## 2019-08-01 ENCOUNTER — Ambulatory Visit (INDEPENDENT_AMBULATORY_CARE_PROVIDER_SITE_OTHER): Payer: 59

## 2019-08-01 DIAGNOSIS — J309 Allergic rhinitis, unspecified: Secondary | ICD-10-CM

## 2019-08-14 ENCOUNTER — Ambulatory Visit (INDEPENDENT_AMBULATORY_CARE_PROVIDER_SITE_OTHER): Payer: 59

## 2019-08-14 DIAGNOSIS — J309 Allergic rhinitis, unspecified: Secondary | ICD-10-CM | POA: Diagnosis not present

## 2019-08-20 ENCOUNTER — Ambulatory Visit (INDEPENDENT_AMBULATORY_CARE_PROVIDER_SITE_OTHER): Payer: 59

## 2019-08-20 DIAGNOSIS — J309 Allergic rhinitis, unspecified: Secondary | ICD-10-CM

## 2019-08-27 ENCOUNTER — Ambulatory Visit (INDEPENDENT_AMBULATORY_CARE_PROVIDER_SITE_OTHER): Payer: 59

## 2019-08-27 DIAGNOSIS — J309 Allergic rhinitis, unspecified: Secondary | ICD-10-CM | POA: Diagnosis not present

## 2019-09-10 ENCOUNTER — Ambulatory Visit (INDEPENDENT_AMBULATORY_CARE_PROVIDER_SITE_OTHER): Payer: 59

## 2019-09-10 DIAGNOSIS — J309 Allergic rhinitis, unspecified: Secondary | ICD-10-CM

## 2019-09-18 ENCOUNTER — Other Ambulatory Visit: Payer: Self-pay | Admitting: Internal Medicine

## 2019-09-19 ENCOUNTER — Ambulatory Visit (INDEPENDENT_AMBULATORY_CARE_PROVIDER_SITE_OTHER): Payer: 59

## 2019-09-19 DIAGNOSIS — J309 Allergic rhinitis, unspecified: Secondary | ICD-10-CM

## 2019-09-24 ENCOUNTER — Ambulatory Visit (INDEPENDENT_AMBULATORY_CARE_PROVIDER_SITE_OTHER): Payer: 59

## 2019-09-24 DIAGNOSIS — J309 Allergic rhinitis, unspecified: Secondary | ICD-10-CM | POA: Diagnosis not present

## 2019-10-01 ENCOUNTER — Ambulatory Visit (INDEPENDENT_AMBULATORY_CARE_PROVIDER_SITE_OTHER): Payer: 59

## 2019-10-01 DIAGNOSIS — J309 Allergic rhinitis, unspecified: Secondary | ICD-10-CM

## 2019-10-09 ENCOUNTER — Ambulatory Visit (INDEPENDENT_AMBULATORY_CARE_PROVIDER_SITE_OTHER): Payer: 59

## 2019-10-09 DIAGNOSIS — J309 Allergic rhinitis, unspecified: Secondary | ICD-10-CM

## 2019-10-17 ENCOUNTER — Ambulatory Visit (INDEPENDENT_AMBULATORY_CARE_PROVIDER_SITE_OTHER): Payer: 59 | Admitting: *Deleted

## 2019-10-17 DIAGNOSIS — J309 Allergic rhinitis, unspecified: Secondary | ICD-10-CM

## 2019-10-22 ENCOUNTER — Ambulatory Visit (INDEPENDENT_AMBULATORY_CARE_PROVIDER_SITE_OTHER): Payer: 59

## 2019-10-22 ENCOUNTER — Encounter: Payer: Self-pay | Admitting: Nurse Practitioner

## 2019-10-22 ENCOUNTER — Ambulatory Visit: Payer: 59 | Admitting: Nurse Practitioner

## 2019-10-22 ENCOUNTER — Other Ambulatory Visit: Payer: Self-pay

## 2019-10-22 VITALS — BP 114/76 | HR 88 | Temp 98.4°F | Ht 66.8 in | Wt 207.0 lb

## 2019-10-22 DIAGNOSIS — R252 Cramp and spasm: Secondary | ICD-10-CM | POA: Diagnosis not present

## 2019-10-22 DIAGNOSIS — L819 Disorder of pigmentation, unspecified: Secondary | ICD-10-CM

## 2019-10-22 DIAGNOSIS — J309 Allergic rhinitis, unspecified: Secondary | ICD-10-CM | POA: Diagnosis not present

## 2019-10-22 MED ORDER — DICLOFENAC SODIUM 1 % EX GEL: 2.0000 g | Freq: Four times a day (QID) | CUTANEOUS | 2 refills | Status: DC

## 2019-10-22 NOTE — Patient Instructions (Signed)
   If you can not get the diclofenac get over the counter voltaren gel.

## 2019-10-22 NOTE — Progress Notes (Signed)
This visit occurred during the SARS-CoV-2 public health emergency.  Safety protocols were in place, including screening questions prior to the visit, additional usage of staff PPE, and extensive cleaning of exam room while observing appropriate contact time as indicated for disinfecting solutions.  Subjective:     Patient ID: Kayla [name] , female    DOB: Dec 18, 1965 , 54 y.o.   MRN: [id number]   Chief Complaint  Patient presents with   Skin Discoloration    on her left upper hip. she stated she feels like her finger goes inside the light spot     HPI  Here for having indentation.  She had a fall in 2013 down a flight of stairs.  Her skin went back to the normal color.  She is having pain from that area down her buttocks.  This has been going on for 2 months.   She has not taken any medications for pain.  Describes sharp and dull pain going down buttocks and hip.    Wt Readings from Last 3 Encounters: 10/22/19 : 207 lb (93.9 kg) 06/25/19 : 205 lb (93 kg) 04/03/19 : 215 lb (97.5 kg)   She feels like her flesh feels like it is separating.  She also reports she has a bulging disc and causing her discomfort.      Past Medical History:  Diagnosis Date   Arthritis    Chronic back pain    Chronic neck pain    Chronic shoulder pain    left   Knee pain    Migraine    Sciatica      Family History  Problem Relation Age of Onset   Cancer Mother    Cancer Father      Current Outpatient Medications:    albuterol (PROVENTIL) (2.5 MG/3ML) 0.083% nebulizer solution, Take 3 mLs (2.5 mg total) by nebulization every 6 (six) hours as needed for wheezing or shortness of breath., Disp: 75 mL, Rfl: 2   albuterol (VENTOLIN HFA) 108 (90 Base) MCG/ACT inhaler, INHALE 2 PUFFS INTO THE LUNGS EVERY 6 HOURS AS NEEDED FOR WHEEZING OR SHORTNESS OF BREATH, Disp: 54 g, Rfl: 2   azelastine (ASTELIN) 0.1 % nasal spray, Place 2 sprays into both nostrils 2 (two) times daily., Disp: 30 mL,  Rfl: 5   cetirizine (ZYRTEC) 10 MG tablet, Take 1 tablet (10 mg total) by mouth daily., Disp: 30 tablet, Rfl: 5   levocetirizine (XYZAL) 5 MG tablet, Take 1 tablet (5 mg total) by mouth every evening., Disp: 30 tablet, Rfl: 5   Linaclotide (LINZESS) 290 MCG CAPS capsule, Take 290 mcg by mouth daily., Disp: , Rfl:    olopatadine (PATANOL) 0.1 % ophthalmic solution, Place 1 drop into both eyes 2 (two) times daily., Disp: 5 mL, Rfl: 5   ondansetron (ZOFRAN) 8 MG tablet, Take 4 mg by mouth every 8 (eight) hours as needed for nausea or vomiting., Disp: , Rfl:    triamcinolone (NASACORT) 55 MCG/ACT AERO nasal inhaler, Place 2 sprays into the nose daily., Disp: 16.5 g, Rfl: 5   Vitamin D, Ergocalciferol, (DRISDOL) 1.25 MG (50000 UNIT) CAPS capsule, TAKE ONE CAPSULE BY MOUTH TWICE WEEKLY( TUESDAY AND FRIDAY), Disp: 26 capsule, Rfl: 1   diclofenac Sodium (VOLTAREN) 1 % GEL, Apply 2 g topically 4 (four) times daily., Disp: 100 g, Rfl: 2   Vilazodone HCl (VIIBRYD) 20 MG TABS, Take 1 tablet (20 mg total) by mouth every morning. (Patient not taking: Reported on 10/22/2019), Disp: 30 tablet, Rfl:  1   Allergies  Allergen Reactions   Codeine Nausea And Vomiting     Review of Systems  Constitutional: Negative.  Negative for fatigue.  Respiratory: Negative.   Cardiovascular: Negative.  Negative for chest pain and leg swelling.  Musculoskeletal:       Left buttocks pain - sharp  Skin: Positive for color change.  Psychiatric/Behavioral: Negative.      Today's Vitals   10/22/19 1058  BP: 114/76  Pulse: 88  Temp: 98.4 F (36.9 C)  TempSrc: Oral  Weight: 207 lb (93.9 kg)  Height: 5' 6.8" (1.697 m)  PainSc: 0-No pain   Body mass index is 32.62 kg/m.   Objective:  Physical Exam Constitutional:      General: She is not in acute distress.    Appearance: Normal appearance. She is obese.  Cardiovascular:     Rate and Rhythm: Normal rate and regular rhythm.     Pulses: Normal pulses.      Heart sounds: Normal heart sounds. No murmur heard.   Pulmonary:     Effort: Pulmonary effort is normal.     Breath sounds: Normal breath sounds.  Skin:    General: Skin is dry.  Neurological:     General: No focal deficit present.     Mental Status: She is alert and oriented to person, place, and time.     Cranial Nerves: No cranial nerve deficit.  Psychiatric:        Mood and Affect: Mood normal.        Behavior: Behavior normal.        Thought Content: Thought content normal.        Judgment: Judgment normal.         Assessment And Plan:     1. Hypopigmented skin lesion  Right posterior hip with hypopigmentation where she had an injury this area is slightly indented - Ambulatory referral to Sports Medicine  2. Muscle cramping  Will provide with diclofenac gel  She feels her muscle is separating and would like to see a specialist I recommend a sports medicine provider and she goes to [facility].   Referral made.    Ddx: sciatic nerve pain vs muscle injury - Ambulatory referral to Sports Medicine - diclofenac Sodium (VOLTAREN) 1 % GEL; Apply 2 g topically 4 (four) times daily.  Dispense: 100 g; Refill: 2     Patient was given opportunity to ask questions. Patient verbalized understanding of the plan and was able to repeat key elements of the plan. All questions were answered to their satisfaction.  Minette Brine, FNP   I, Minette Brine, FNP, have reviewed all documentation for this visit. The documentation on 10/22/19 for the exam, diagnosis, procedures, and orders are all accurate and complete.  THE PATIENT IS ENCOURAGED TO PRACTICE SOCIAL DISTANCING DUE TO THE COVID-19 PANDEMIC.

## 2019-10-30 ENCOUNTER — Other Ambulatory Visit: Payer: Self-pay | Admitting: Internal Medicine

## 2019-11-01 ENCOUNTER — Ambulatory Visit (INDEPENDENT_AMBULATORY_CARE_PROVIDER_SITE_OTHER): Payer: 59

## 2019-11-01 DIAGNOSIS — J309 Allergic rhinitis, unspecified: Secondary | ICD-10-CM

## 2019-11-06 ENCOUNTER — Other Ambulatory Visit: Payer: Self-pay

## 2019-11-06 ENCOUNTER — Encounter: Payer: Self-pay | Admitting: Nurse Practitioner

## 2019-11-06 ENCOUNTER — Ambulatory Visit: Payer: 59 | Admitting: Nurse Practitioner

## 2019-11-06 ENCOUNTER — Ambulatory Visit (INDEPENDENT_AMBULATORY_CARE_PROVIDER_SITE_OTHER): Payer: 59

## 2019-11-06 VITALS — BP 118/78 | HR 70 | Temp 97.8°F | Ht 65.6 in | Wt 206.6 lb

## 2019-11-06 DIAGNOSIS — Z0000 Encounter for general adult medical examination without abnormal findings: Secondary | ICD-10-CM | POA: Diagnosis not present

## 2019-11-06 DIAGNOSIS — Z1159 Encounter for screening for other viral diseases: Secondary | ICD-10-CM

## 2019-11-06 DIAGNOSIS — Z139 Encounter for screening, unspecified: Secondary | ICD-10-CM

## 2019-11-06 DIAGNOSIS — R7989 Other specified abnormal findings of blood chemistry: Secondary | ICD-10-CM | POA: Diagnosis not present

## 2019-11-06 DIAGNOSIS — E559 Vitamin D deficiency, unspecified: Secondary | ICD-10-CM

## 2019-11-06 DIAGNOSIS — J309 Allergic rhinitis, unspecified: Secondary | ICD-10-CM | POA: Diagnosis not present

## 2019-11-06 NOTE — Progress Notes (Signed)
I,Yamilka Roman [employer] as a Education administrator for [employer], FNP.,have documented all relevant documentation on the behalf of Minette Brine, FNP,as directed by  Minette Brine, FNP while in the presence of Minette Brine, [place].This visit occurred during the SARS-CoV-2 public health emergency.  Safety protocols were in place, including screening questions prior to the visit, additional usage of staff PPE, and extensive cleaning of exam room while observing appropriate contact time as indicated for disinfecting solutions.  Subjective:     Patient ID: Kayla [name] , female    DOB: 1965/12/14 , 54 y.o.   MRN: [id number]   Chief Complaint  Patient presents with  . Annual Exam    HPI  Here for HM    Past Medical History:  Diagnosis Date  . Arthritis   . Chronic back pain   . Chronic neck pain   . Chronic shoulder pain    left  . Knee pain   . Migraine   . Sciatica      Family History  Problem Relation Age of Onset  . Cancer Mother   . Cancer Father      Current Outpatient Medications:  .  albuterol (PROVENTIL) (2.5 MG/3ML) 0.083% nebulizer solution, Take 3 mLs (2.5 mg total) by nebulization every 6 (six) hours as needed for wheezing or shortness of breath., Disp: 75 mL, Rfl: 2 .  albuterol (VENTOLIN HFA) 108 (90 Base) MCG/ACT inhaler, INHALE 2 PUFFS INTO THE LUNGS EVERY 6 HOURS AS NEEDED FOR WHEEZING OR SHORTNESS OF BREATH, Disp: 54 g, Rfl: 2 .  azelastine (ASTELIN) 0.1 % nasal spray, Place 2 sprays into both nostrils 2 (two) times daily., Disp: 30 mL, Rfl: 5 .  cetirizine (ZYRTEC) 10 MG tablet, Take 1 tablet (10 mg total) by mouth daily., Disp: 30 tablet, Rfl: 5 .  diclofenac Sodium (VOLTAREN) 1 % GEL, Apply 2 g topically 4 (four) times daily., Disp: 100 g, Rfl: 2 .  levocetirizine (XYZAL) 5 MG tablet, TAKE 1 TABLET(5 MG) BY MOUTH EVERY EVENING, Disp: 30 tablet, Rfl: 5 .  Linaclotide (LINZESS) 290 MCG CAPS capsule, Take 290 mcg by mouth daily., Disp: , Rfl:  .  olopatadine  (PATANOL) 0.1 % ophthalmic solution, Place 1 drop into both eyes 2 (two) times daily., Disp: 5 mL, Rfl: 5 .  ondansetron (ZOFRAN) 8 MG tablet, Take 4 mg by mouth every 8 (eight) hours as needed for nausea or vomiting., Disp: , Rfl:  .  triamcinolone (NASACORT) 55 MCG/ACT AERO nasal inhaler, Place 2 sprays into the nose daily., Disp: 16.5 g, Rfl: 5 .  Vitamin D, Ergocalciferol, (DRISDOL) 1.25 MG (50000 UNIT) CAPS capsule, TAKE ONE CAPSULE BY MOUTH TWICE WEEKLY( TUESDAY AND FRIDAY), Disp: 26 capsule, Rfl: 1   Allergies  Allergen Reactions  . Codeine Nausea And Vomiting      The patient states she uses post menopausal status for birth control. Last LMP was No LMP recorded (lmp unknown). Patient is postmenopausal.. Negative for Dysmenorrhea and Negative for Menorrhagia. Negative for: breast discharge, breast lump(s), breast pain and breast self exam. Associated symptoms include abnormal vaginal bleeding. Pertinent negatives include abnormal bleeding (hematology), anxiety, decreased libido, depression, difficulty falling sleep, dyspareunia, history of infertility, nocturia, sexual dysfunction, sleep disturbances, urinary incontinence, urinary urgency, vaginal discharge and vaginal itching. Diet regular.  The patient states her exercise level is minimal.   The patient's tobacco use is:  Social History   Tobacco Use  Smoking Status Light Tobacco Smoker  . Packs/day: 0.25  . Years: 34.00  .  Pack years: 8.50  . Types: Cigarettes  Smokeless Tobacco Never Used   She has been exposed to passive smoke. The patient's alcohol use is:  Social History   Substance and Sexual Activity  Alcohol Use Yes  . Alcohol/week: 1.0 standard drink  . Types: 1 Glasses of wine per week   Comment: occasionally   Additional information: Last pap By Dr. Charna Archer   Review of Systems  Constitutional: Negative.   HENT: Negative.   Eyes: Negative.   Respiratory: Negative.   Cardiovascular: Negative.    Gastrointestinal: Negative.   Endocrine: Negative.   Genitourinary: Negative.   Musculoskeletal: Negative.   Skin: Negative.   Allergic/Immunologic: Negative.   Neurological: Negative.   Hematological: Negative.   Psychiatric/Behavioral: Negative.      Today's Vitals   11/06/19 1001  BP: 118/78  Pulse: 70  Temp: 97.8 F (36.6 C)  TempSrc: Oral  Weight: 206 lb 9.6 oz (93.7 kg)  Height: 5' 5.6" (1.666 m)  PainSc: 0-No pain   Body mass index is 33.75 kg/m.   Objective:  Physical Exam Constitutional:      General: She is not in acute distress.    Appearance: Normal appearance. She is well-developed. She is obese.  HENT:     Head: Normocephalic and atraumatic.     Right Ear: Hearing, tympanic membrane, ear canal and external ear normal. There is no impacted cerumen.     Left Ear: Hearing, tympanic membrane, ear canal and external ear normal. There is no impacted cerumen.     Nose: Nose normal.     Mouth/Throat:     Mouth: Mucous membranes are dry.  Eyes:     General: Lids are normal.     Extraocular Movements: Extraocular movements intact.     Conjunctiva/sclera: Conjunctivae normal.     Pupils: Pupils are equal, round, and reactive to light.     Funduscopic exam:    Right eye: No papilledema.        Left eye: No papilledema.  Neck:     Thyroid: No thyroid mass.     Vascular: No carotid bruit.  Cardiovascular:     Rate and Rhythm: Normal rate and regular rhythm.     Pulses: Normal pulses.     Heart sounds: Normal heart sounds. No murmur heard.   Pulmonary:     Effort: Pulmonary effort is normal.     Breath sounds: Normal breath sounds.  Abdominal:     General: Abdomen is flat. Bowel sounds are normal. There is no distension.     Palpations: Abdomen is soft.     Tenderness: There is no abdominal tenderness.  Genitourinary:    Rectum: Guaiac result negative.  Musculoskeletal:        General: No swelling. Normal range of motion.     Cervical back: Full  passive range of motion without pain, normal range of motion and neck supple.     Right lower leg: No edema.     Left lower leg: No edema.  Skin:    General: Skin is warm and dry.     Capillary Refill: Capillary refill takes less than 2 seconds.  Neurological:     General: No focal deficit present.     Mental Status: She is alert and oriented to person, place, and time.     Cranial Nerves: No cranial nerve deficit.     Sensory: No sensory deficit.  Psychiatric:        Mood and Affect: Mood  normal.        Behavior: Behavior normal.        Thought Content: Thought content normal.        Judgment: Judgment normal.         Assessment And Plan:     1. Encounter for general adult medical examination w/o abnormal findings Behavior modifications discussed and diet history reviewed.   Pt will continue to exercise regularly and modify diet with low GI, plant based foods and decrease intake of processed foods.  Recommend intake of daily multivitamin, Vitamin D, and calcium.  Recommend for preventive screenings, as well as recommend immunizations that include TDAP - CMP14+EGFR - CBC - Hemoglobin A1c - Lipid panel  2. Abnormal thyroid blood test  Will recheck thyroid has been low TSH - TSH - T3, free  3. Vitamin D deficiency disease  Will check vitamin D level and supplement as needed.     Also encouraged to spend 15 minutes in the sun daily.  - VITAMIN D 25 Hydroxy (Vit-D Deficiency, Fractures)  4. Encounter for hepatitis C screening test for low risk patient  Will check Hepatitis C screening due to recent recommendations to screen all adults 18 years and older - Hepatitis C antibody  5. Encounter for screening  Needed for her job health form - SARS-CoV-2 Semi-Quantitative Total Antibody, Spike    Patient was given opportunity to ask questions. Patient verbalized understanding of the plan and was able to repeat key elements of the plan. All questions were answered to  their satisfaction.   Teola Bradley, FNP, have reviewed all documentation for this visit. The documentation on 11/22/19 for the exam, diagnosis, procedures, and orders are all accurate and complete.  THE PATIENT IS ENCOURAGED TO PRACTICE SOCIAL DISTANCING DUE TO THE COVID-19 PANDEMIC.

## 2019-11-06 NOTE — Patient Instructions (Signed)
Health Maintenance, Female Adopting a healthy lifestyle and getting preventive care are important in promoting health and wellness. Ask your health care provider about:  The right schedule for you to have regular tests and exams.  Things you can do on your own to prevent diseases and keep yourself healthy. What should I know about diet, weight, and exercise? Eat a healthy diet   Eat a diet that includes plenty of vegetables, fruits, low-fat dairy products, and lean protein.  Do not eat a lot of foods that are high in solid fats, added sugars, or sodium. Maintain a healthy weight Body mass index (BMI) is used to identify weight problems. It estimates body fat based on height and weight. Your health care provider can help determine your BMI and help you achieve or maintain a healthy weight. Get regular exercise Get regular exercise. This is one of the most important things you can do for your health. Most adults should:  Exercise for at least 150 minutes each week. The exercise should increase your heart rate and make you sweat (moderate-intensity exercise).  Do strengthening exercises at least twice a week. This is in addition to the moderate-intensity exercise.  Spend less time sitting. Even light physical activity can be beneficial. Watch cholesterol and blood lipids Have your blood tested for lipids and cholesterol at 53 years of age, then have this test every 5 years. Have your cholesterol levels checked more often if:  Your lipid or cholesterol levels are high.  You are older than 53 years of age.  You are at high risk for heart disease. What should I know about cancer screening? Depending on your health history and family history, you may need to have cancer screening at various ages. This may include screening for:  Breast cancer.  Cervical cancer.  Colorectal cancer.  Skin cancer.  Lung cancer. What should I know about heart disease, diabetes, and high blood  pressure? Blood pressure and heart disease  High blood pressure causes heart disease and increases the risk of stroke. This is more likely to develop in people who have high blood pressure readings, are of African descent, or are overweight.  Have your blood pressure checked: ? Every 3-5 years if you are 18-39 years of age. ? Every year if you are 40 years old or older. Diabetes Have regular diabetes screenings. This checks your fasting blood sugar level. Have the screening done:  Once every three years after age 40 if you are at a normal weight and have a low risk for diabetes.  More often and at a younger age if you are overweight or have a high risk for diabetes. What should I know about preventing infection? Hepatitis B If you have a higher risk for hepatitis B, you should be screened for this virus. Talk with your health care provider to find out if you are at risk for hepatitis B infection. Hepatitis C Testing is recommended for:  Everyone born from 1945 through 1965.  Anyone with known risk factors for hepatitis C. Sexually transmitted infections (STIs)  Get screened for STIs, including gonorrhea and chlamydia, if: ? You are sexually active and are younger than 53 years of age. ? You are older than 53 years of age and your health care provider tells you that you are at risk for this type of infection. ? Your sexual activity has changed since you were last screened, and you are at increased risk for chlamydia or gonorrhea. Ask your health care provider if   you are at risk.  Ask your health care provider about whether you are at high risk for HIV. Your health care provider may recommend a prescription medicine to help prevent HIV infection. If you choose to take medicine to prevent HIV, you should first get tested for HIV. You should then be tested every 3 months for as long as you are taking the medicine. Pregnancy  If you are about to stop having your period (premenopausal) and  you may become pregnant, seek counseling before you get pregnant.  Take 400 to 800 micrograms (mcg) of folic acid every day if you become pregnant.  Ask for birth control (contraception) if you want to prevent pregnancy. Osteoporosis and menopause Osteoporosis is a disease in which the bones lose minerals and strength with aging. This can result in bone fractures. If you are 65 years old or older, or if you are at risk for osteoporosis and fractures, ask your health care provider if you should:  Be screened for bone loss.  Take a calcium or vitamin D supplement to lower your risk of fractures.  Be given hormone replacement therapy (HRT) to treat symptoms of menopause. Follow these instructions at home: Lifestyle  Do not use any products that contain nicotine or tobacco, such as cigarettes, e-cigarettes, and chewing tobacco. If you need help quitting, ask your health care provider.  Do not use street drugs.  Do not share needles.  Ask your health care provider for help if you need support or information about quitting drugs. Alcohol use  Do not drink alcohol if: ? Your health care provider tells you not to drink. ? You are pregnant, may be pregnant, or are planning to become pregnant.  If you drink alcohol: ? Limit how much you use to 0-1 drink a day. ? Limit intake if you are breastfeeding.  Be aware of how much alcohol is in your drink. In the [place] one drink equals one 12 oz bottle of beer (355 mL), one 5 oz glass of wine (148 mL), or one 1 oz glass of hard liquor (44 mL). General instructions  Schedule regular health, dental, and eye exams.  Stay current with your vaccines.  Tell your health care provider if: ? You often feel depressed. ? You have ever been abused or do not feel safe at home. Summary  Adopting a healthy lifestyle and getting preventive care are important in promoting health and wellness.  Follow your health care provider's instructions about healthy  diet, exercising, and getting tested or screened for diseases.  Follow your health care provider's instructions on monitoring your cholesterol and blood pressure. This information is not intended to replace advice given to you by your health care provider. Make sure you discuss any questions you have with your health care provider. Document Revised: 02/22/2018 Document Reviewed: 02/22/2018 Elsevier Patient Education  2020 [employer] Inc.  

## 2019-11-07 ENCOUNTER — Encounter: Payer: Self-pay | Admitting: Internal Medicine

## 2019-11-07 LAB — TSH: TSH: 0.37 u[IU]/mL — ABNORMAL LOW (ref 0.450–4.500)

## 2019-11-07 LAB — CBC
Hematocrit: 39.7 % (ref 34.0–46.6)
Hemoglobin: 12.7 g/dL (ref 11.1–15.9)
MCH: 26 pg — ABNORMAL LOW (ref 26.6–33.0)
MCHC: 32 g/dL (ref 31.5–35.7)
MCV: 81 fL (ref 79–97)
Platelets: 267 10*3/uL (ref 150–450)
RBC: 4.88 x10E6/uL (ref 3.77–5.28)
RDW: 13.9 % (ref 11.7–15.4)
WBC: 3.2 10*3/uL — ABNORMAL LOW (ref 3.4–10.8)

## 2019-11-07 LAB — CMP14+EGFR
ALT: 12 IU/L (ref 0–32)
AST: 15 IU/L (ref 0–40)
Albumin/Globulin Ratio: 1.6 (ref 1.2–2.2)
Albumin: 4.2 g/dL (ref 3.8–4.9)
Alkaline Phosphatase: 65 IU/L (ref 48–121)
BUN/Creatinine Ratio: 16 (ref 9–23)
BUN: 14 mg/dL (ref 6–24)
Bilirubin Total: 0.2 mg/dL (ref 0.0–1.2)
CO2: 25 mmol/L (ref 20–29)
Calcium: 9.7 mg/dL (ref 8.7–10.2)
Chloride: 103 mmol/L (ref 96–106)
Creatinine, Ser: 0.87 mg/dL (ref 0.57–1.00)
GFR calc Af Amer: 89 mL/min/{1.73_m2} (ref >59)
GFR calc non Af Amer: 77 mL/min/{1.73_m2} (ref >59)
Globulin, Total: 2.6 g/dL (ref 1.5–4.5)
Glucose: 62 mg/dL — ABNORMAL LOW (ref 65–99)
Potassium: 4.5 mmol/L (ref 3.5–5.2)
Sodium: 140 mmol/L (ref 134–144)
Total Protein: 6.8 g/dL (ref 6.0–8.5)

## 2019-11-07 LAB — LIPID PANEL
Chol/HDL Ratio: 2.6 ratio (ref 0.0–4.4)
Cholesterol, Total: 210 mg/dL — ABNORMAL HIGH (ref 100–199)
HDL: 81 mg/dL (ref >39)
LDL Chol Calc (NIH): 120 mg/dL — ABNORMAL HIGH (ref 0–99)
Triglycerides: 52 mg/dL (ref 0–149)
VLDL Cholesterol Cal: 9 mg/dL (ref 5–40)

## 2019-11-07 LAB — HEPATITIS C ANTIBODY: Hep C Virus Ab: <0.1 s/co ratio (ref 0.0–0.9)

## 2019-11-07 LAB — VITAMIN D 25 HYDROXY (VIT D DEFICIENCY, FRACTURES): Vit D, 25-Hydroxy: 48.9 ng/mL (ref 30.0–100.0)

## 2019-11-07 LAB — T3, FREE: T3, Free: 2.7 pg/mL (ref 2.0–4.4)

## 2019-11-07 LAB — SARS-COV-2 SEMI-QUANTITATIVE TOTAL ANTIBODY, SPIKE
SARS-CoV-2 Semi-Quant Total Ab: 1238 U/mL (ref <0.8)
SARS-CoV-2 Spike Ab Interp: POSITIVE

## 2019-11-07 LAB — HEMOGLOBIN A1C
Est. average glucose Bld gHb Est-mCnc: 111 mg/dL
Hgb A1c MFr Bld: 5.5 % (ref 4.8–5.6)

## 2019-11-08 ENCOUNTER — Other Ambulatory Visit: Payer: Self-pay | Admitting: Nurse Practitioner

## 2019-11-08 DIAGNOSIS — R946 Abnormal results of thyroid function studies: Secondary | ICD-10-CM

## 2019-11-12 ENCOUNTER — Ambulatory Visit (INDEPENDENT_AMBULATORY_CARE_PROVIDER_SITE_OTHER): Payer: 59

## 2019-11-12 DIAGNOSIS — J309 Allergic rhinitis, unspecified: Secondary | ICD-10-CM | POA: Diagnosis not present

## 2019-11-15 ENCOUNTER — Encounter: Payer: Self-pay | Admitting: Internal Medicine

## 2019-11-20 ENCOUNTER — Ambulatory Visit (INDEPENDENT_AMBULATORY_CARE_PROVIDER_SITE_OTHER): Payer: 59

## 2019-11-20 DIAGNOSIS — J309 Allergic rhinitis, unspecified: Secondary | ICD-10-CM

## 2019-11-22 ENCOUNTER — Encounter: Payer: Self-pay | Admitting: Nurse Practitioner

## 2019-11-30 ENCOUNTER — Ambulatory Visit (INDEPENDENT_AMBULATORY_CARE_PROVIDER_SITE_OTHER): Payer: 59 | Admitting: *Deleted

## 2019-11-30 DIAGNOSIS — J309 Allergic rhinitis, unspecified: Secondary | ICD-10-CM

## 2019-12-03 ENCOUNTER — Ambulatory Visit: Payer: 59 | Admitting: Podiatry

## 2019-12-05 ENCOUNTER — Encounter: Payer: Managed Care, Other (non HMO) | Admitting: Internal Medicine

## 2019-12-06 ENCOUNTER — Ambulatory Visit (INDEPENDENT_AMBULATORY_CARE_PROVIDER_SITE_OTHER): Payer: 59 | Admitting: *Deleted

## 2019-12-06 DIAGNOSIS — J309 Allergic rhinitis, unspecified: Secondary | ICD-10-CM | POA: Diagnosis not present

## 2019-12-12 ENCOUNTER — Ambulatory Visit (INDEPENDENT_AMBULATORY_CARE_PROVIDER_SITE_OTHER): Payer: 59

## 2019-12-12 DIAGNOSIS — J309 Allergic rhinitis, unspecified: Secondary | ICD-10-CM

## 2019-12-18 ENCOUNTER — Other Ambulatory Visit: Payer: Self-pay | Admitting: Internal Medicine

## 2019-12-18 DIAGNOSIS — Z1231 Encounter for screening mammogram for malignant neoplasm of breast: Secondary | ICD-10-CM

## 2019-12-19 ENCOUNTER — Encounter: Payer: Self-pay | Admitting: Internal Medicine

## 2019-12-19 ENCOUNTER — Ambulatory Visit (INDEPENDENT_AMBULATORY_CARE_PROVIDER_SITE_OTHER): Payer: 59 | Admitting: *Deleted

## 2019-12-19 ENCOUNTER — Ambulatory Visit: Payer: 59 | Admitting: Internal Medicine

## 2019-12-19 ENCOUNTER — Other Ambulatory Visit: Payer: Self-pay

## 2019-12-19 VITALS — BP 128/84 | HR 75 | Ht 65.6 in | Wt 198.2 lb

## 2019-12-19 DIAGNOSIS — D72819 Decreased white blood cell count, unspecified: Secondary | ICD-10-CM | POA: Diagnosis not present

## 2019-12-19 DIAGNOSIS — E0590 Thyrotoxicosis, unspecified without thyrotoxic crisis or storm: Secondary | ICD-10-CM | POA: Insufficient documentation

## 2019-12-19 DIAGNOSIS — J309 Allergic rhinitis, unspecified: Secondary | ICD-10-CM | POA: Diagnosis not present

## 2019-12-19 DIAGNOSIS — G4733 Obstructive sleep apnea (adult) (pediatric): Secondary | ICD-10-CM | POA: Diagnosis not present

## 2019-12-19 NOTE — Patient Instructions (Addendum)
-   Please stop by the lab today  

## 2019-12-19 NOTE — Progress Notes (Signed)
Name: Kayla [name]  MRN/ DOB: [id number], 1965/06/04    Age/ Sex: 54 y.o., female    PCP: Glendale Chard, MD   Reason for Endocrinology Evaluation: Subclinical hyperthyroidism     Date of Initial Endocrinology Evaluation: 12/19/2019     HPI: Kayla [name] is a 54 y.o. female with a past medical history of depression and environmental allergies, OSA on CPAP machine. The patient presented for initial endocrinology clinic visit on 12/19/2019 for consultative assistance with her subclinical hyperthyroidism.   Pt has been noted with low TSH since 06/2019 with a nadir of 0.370 uIU/mL but with normal FT4.   She is c/o fatigue, puffiness around her eyes, and occasional  insomnia but also when she sleeps many hours, she feels tired.    Denies diarrhea but has gastroparesis  Has palpitations , anxiety and occasional tremors   Has noted unintentional  weight  Loss   Denies osteoporosis , cardiac arrhthymia No local neck symptoms     She has not been using CPAP for over a year    Sister with thyroid disease ( hyperthyroidism), nephew and nieces with thyroid disease    HISTORY:  Past Medical History:  Past Medical History:  Diagnosis Date   Arthritis    Chronic back pain    Chronic neck pain    Chronic shoulder pain    left   Knee pain    Migraine    Sciatica    Past Surgical History:  Past Surgical History:  Procedure Laterality Date   CESAREAN SECTION     FOOT SURGERY Left 2018   LESION EXCISION Right    X3   SHOULDER SURGERY Left 2015   TUBAL LIGATION        Social History:  reports that she has been smoking cigarettes. She has a 8.50 pack-year smoking history. She has never used smokeless tobacco. She reports current alcohol use of about 1.0 standard drink of alcohol per week. She reports current drug use. Drug: Marijuana.  Family History: family history includes Cancer in her father and mother.   HOME MEDICATIONS: Allergies as of  12/19/2019      Reactions   Codeine Nausea And Vomiting      Medication List       Accurate as of December 19, 2019  9:50 AM. If you have any questions, ask your nurse or doctor.        albuterol (2.5 MG/3ML) 0.083% nebulizer solution Commonly known as: PROVENTIL Take 3 mLs (2.5 mg total) by nebulization every 6 (six) hours as needed for wheezing or shortness of breath.   albuterol 108 (90 Base) MCG/ACT inhaler Commonly known as: VENTOLIN HFA INHALE 2 PUFFS INTO THE LUNGS EVERY 6 HOURS AS NEEDED FOR WHEEZING OR SHORTNESS OF BREATH   azelastine 0.1 % nasal spray Commonly known as: ASTELIN Place 2 sprays into both nostrils 2 (two) times daily.   cetirizine 10 MG tablet Commonly known as: ZYRTEC Take 1 tablet (10 mg total) by mouth daily.   diclofenac Sodium 1 % Gel Commonly known as: VOLTAREN Apply 2 g topically 4 (four) times daily.   levocetirizine 5 MG tablet Commonly known as: XYZAL TAKE 1 TABLET(5 MG) BY MOUTH EVERY EVENING   Linzess 290 MCG Caps capsule Generic drug: linaclotide Take 290 mcg by mouth daily.   olopatadine 0.1 % ophthalmic solution Commonly known as: PATANOL Place 1 drop into both eyes 2 (two) times daily.   ondansetron 8 MG tablet Commonly  known as: ZOFRAN Take 4 mg by mouth every 8 (eight) hours as needed for nausea or vomiting.   triamcinolone 55 MCG/ACT Aero nasal inhaler Commonly known as: NASACORT Place 2 sprays into the nose daily.   Vitamin D (Ergocalciferol) 1.25 MG (50000 UNIT) Caps capsule Commonly known as: DRISDOL TAKE ONE CAPSULE BY MOUTH TWICE WEEKLY( TUESDAY AND FRIDAY)         REVIEW OF SYSTEMS: A comprehensive ROS was conducted with the patient and is negative except as per HPI      OBJECTIVE:  VS: BP 128/84 (BP Location: Right Arm, Patient Position: Sitting, Cuff Size: Normal)    Pulse 75    Ht 5' 5.6" (1.666 m)    Wt 198 lb 3.2 oz (89.9 kg)    LMP  (LMP Unknown)    SpO2 99%    BMI 32.38 kg/m    Wt Readings from  Last 3 Encounters:  12/19/19 198 lb 3.2 oz (89.9 kg)  11/06/19 206 lb 9.6 oz (93.7 kg)  10/22/19 207 lb (93.9 kg)     EXAM: General: Pt appears well and is in NAD  Neck: General: Supple without adenopathy. Thyroid: Thyroid is prominent.  No goiter or nodules appreciated. No thyroid bruit.  Lungs: Clear with good BS bilat with no rales, rhonchi, or wheezes  Heart: Auscultation: RRR.  Abdomen: Normoactive bowel sounds, soft, nontender, without masses or organomegaly palpable  Extremities:  BL LE: No pretibial edema normal ROM and strength.  Skin: Hair: Texture and amount normal with gender appropriate distribution Skin Inspection: No rashes Skin Palpation: Skin temperature, texture, and thickness normal to palpation  Neuro: Cranial nerves: II - XII grossly intact  Motor: Normal strength throughout DTRs: 2+ and symmetric in UE without delay in relaxation phase  Mental Status: Judgment, insight: Intact Orientation: Oriented to time, place, and person Mood and affect: No depression, anxiety, or agitation     DATA REVIEWED: Results for Kayla, [name] (MRN [id number]) as of 12/21/2019 09:26  Ref. Range 12/19/2019 10:23  Sodium Latest Ref Range: 134 - 144 mmol/L 141  Potassium Latest Ref Range: 3.5 - 5.2 mmol/L 4.5  Chloride Latest Ref Range: 96 - 106 mmol/L 103  CO2 Latest Ref Range: 20 - 29 mmol/L 22  Glucose Latest Ref Range: 65 - 99 mg/dL 78  BUN Latest Ref Range: 6 - 24 mg/dL 12  Creatinine Latest Ref Range: 0.57 - 1.00 mg/dL 0.90  Calcium Latest Ref Range: 8.7 - 10.2 mg/dL 9.6  BUN/Creatinine Ratio Latest Ref Range: 9 - 23  13  Alkaline Phosphatase Latest Ref Range: 44 - 121 IU/L 64  Albumin Latest Ref Range: 3.8 - 4.9 g/dL 4.3  Albumin/Globulin Ratio Latest Ref Range: 1.2 - 2.2  1.6  AST Latest Ref Range: 0 - 40 IU/L 12  ALT Latest Ref Range: 0 - 32 IU/L 10  Total Protein Latest Ref Range: 6.0 - 8.5 g/dL 7.0  Total Bilirubin Latest Ref Range: 0.0 - 1.2 mg/dL 0.2  GFR,  Est Non African American Latest Ref Range: >59 mL/min/1.73 73  GFR, Est African American Latest Ref Range: >59 mL/min/1.73 84  Globulin, Total Latest Ref Range: 1.5 - 4.5 g/dL 2.7  WBC Latest Ref Range: 3.4 - 10.8 x10E3/uL 2.7 (L)  RBC Latest Ref Range: 3.77 - 5.28 x10E6/uL 5.15  Hemoglobin Latest Ref Range: 11.1 - 15.9 g/dL 13.8  HCT Latest Ref Range: 34.0 - 46.6 % 41.7  MCV Latest Ref Range: 79 - 97 fL 81  MCH  Latest Ref Range: 26.6 - 33.0 pg 26.8  MCHC Latest Ref Range: 31 - 35 g/dL 33.1    ASSESSMENT/PLAN/RECOMMENDATIONS:   1. Subclinical Hyperthyroidism    - Pt with non-specific symptoms, that could be related to her thyroid, but I have discussed with her the importance of managing OSA, as some of her symptoms could be caused by untreated OSA  - The causes of subclinical hyperthyroidism are the same as the causes of overt hyperthyroidism, and like overt hyperthyroidism, subclinical hyperthyroidism can be persistent or transient. Common causes of subclinical hyperthyroidism include autonomously functioning thyroid adenomas and multinodular goiters , or Graves' disease , subacute thyroiditis is another differential but I doubt this is the case here.    - Most patients with subclinical hyperthyroidism have no clinical manifestations of hyperthyroidism, and those symptoms that are present (eg, tachycardia, tremor, dyspnea on exertion, weight loss) are mild and nonspecific." However, subclinical hyperthyroidism is associated with an increased risk of atrial fibrillation and, primarily in postmenopausal women, a decrease in bone mineral density.   - We discussed thionamide therapy as an option if needed. We discussed side effects   We discussed that Graves' Disease is a result of an autoimmune condition involving the thyroid.    We discussed  the possible side effects of methimazole including the chance of rash, the small chance of liver irritation/juandice and the <=1 in 300-400 chance of  sudden onset agranulocytosis.  We discussed importance of going to ED promptly (and stopping methimazole) if shewere to develop significant fever with severe sore throat of other evidence of acute infection.        -Repeat TFTs are normal, no need to start any therapy   2. OSA:   - She has been without the mask for ~ 1 yr , she did not find the mask comforting during sleep. I have emphasized the risk of untreated OSA such as HTN, increased risk of cardiovascular disease as well as fatigue - I have encouraged her to get this followed first    3.  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  I have advised the patient to seek hematological work-up but this has not been done in the past.      Signed electronically by: Mack Guise, MD  [facility] Endocrinology  [address] [place], [place] [address] Phone: [phone] FAX: [phone]   CC: Glendale Chard, [address] [place] [address] Phone: [phone] Fax: [phone]   Return to Endocrinology clinic as below: Future Appointments  Date Time Provider [place]  12/26/2019  2:45 PM Glendale Chard, MD [facility] None  01/07/2020  4:20 PM [facility] MM 2 [facility]  11/06/2020 10:30 AM Glendale Chard, MD [facility] None

## 2019-12-20 LAB — COMPREHENSIVE METABOLIC PANEL
ALT: 10 IU/L (ref 0–32)
AST: 12 IU/L (ref 0–40)
Albumin/Globulin Ratio: 1.6 (ref 1.2–2.2)
Albumin: 4.3 g/dL (ref 3.8–4.9)
Alkaline Phosphatase: 64 IU/L (ref 44–121)
BUN/Creatinine Ratio: 13 (ref 9–23)
BUN: 12 mg/dL (ref 6–24)
Bilirubin Total: 0.2 mg/dL (ref 0.0–1.2)
CO2: 22 mmol/L (ref 20–29)
Calcium: 9.6 mg/dL (ref 8.7–10.2)
Chloride: 103 mmol/L (ref 96–106)
Creatinine, Ser: 0.9 mg/dL (ref 0.57–1.00)
GFR calc Af Amer: 84 mL/min/{1.73_m2} (ref >59)
GFR calc non Af Amer: 73 mL/min/{1.73_m2} (ref >59)
Globulin, Total: 2.7 g/dL (ref 1.5–4.5)
Glucose: 78 mg/dL (ref 65–99)
Potassium: 4.5 mmol/L (ref 3.5–5.2)
Sodium: 141 mmol/L (ref 134–144)
Total Protein: 7 g/dL (ref 6.0–8.5)

## 2019-12-20 LAB — TSH: TSH: 0.528 u[IU]/mL (ref 0.450–4.500)

## 2019-12-20 LAB — CBC WITH DIFFERENTIAL/PLATELET
Basophils Absolute: 0 10*3/uL (ref 0.0–0.2)
Basos: 2 %
EOS (ABSOLUTE): 0.2 10*3/uL (ref 0.0–0.4)
Eos: 6 %
Hematocrit: 41.7 % (ref 34.0–46.6)
Hemoglobin: 13.8 g/dL (ref 11.1–15.9)
Immature Grans (Abs): 0 10*3/uL (ref 0.0–0.1)
Immature Granulocytes: 0 %
Lymphocytes Absolute: 1.2 10*3/uL (ref 0.7–3.1)
Lymphs: 43 %
MCH: 26.8 pg (ref 26.6–33.0)
MCHC: 33.1 g/dL (ref 31.5–35.7)
MCV: 81 fL (ref 79–97)
Monocytes Absolute: 0.3 10*3/uL (ref 0.1–0.9)
Monocytes: 10 %
Neutrophils Absolute: 1 10*3/uL — ABNORMAL LOW (ref 1.4–7.0)
Neutrophils: 39 %
Platelets: 279 10*3/uL (ref 150–450)
RBC: 5.15 x10E6/uL (ref 3.77–5.28)
RDW: 14 % (ref 11.7–15.4)
WBC: 2.7 10*3/uL — ABNORMAL LOW (ref 3.4–10.8)

## 2019-12-20 LAB — THYROID STIMULATING IMMUNOGLOBULIN: Thyroid Stim Immunoglobulin: <0.1 IU/L (ref 0.00–0.55)

## 2019-12-20 LAB — T4, FREE: Free T4: 1.07 ng/dL (ref 0.82–1.77)

## 2019-12-20 LAB — T3: T3, Total: 122 ng/dL (ref 71–180)

## 2019-12-21 DIAGNOSIS — G4733 Obstructive sleep apnea (adult) (pediatric): Secondary | ICD-10-CM | POA: Insufficient documentation

## 2019-12-21 DIAGNOSIS — D72819 Decreased white blood cell count, unspecified: Secondary | ICD-10-CM | POA: Insufficient documentation

## 2019-12-24 ENCOUNTER — Other Ambulatory Visit: Payer: Self-pay

## 2019-12-24 ENCOUNTER — Encounter: Payer: Self-pay | Admitting: Podiatry

## 2019-12-24 ENCOUNTER — Ambulatory Visit: Payer: 59 | Admitting: Podiatry

## 2019-12-24 ENCOUNTER — Ambulatory Visit (INDEPENDENT_AMBULATORY_CARE_PROVIDER_SITE_OTHER): Payer: 59

## 2019-12-24 DIAGNOSIS — M76821 Posterior tibial tendinitis, right leg: Secondary | ICD-10-CM

## 2019-12-24 DIAGNOSIS — B351 Tinea unguium: Secondary | ICD-10-CM | POA: Diagnosis not present

## 2019-12-24 DIAGNOSIS — R52 Pain, unspecified: Secondary | ICD-10-CM

## 2019-12-24 MED ORDER — DICLOFENAC SODIUM 75 MG PO TBEC: 75.0000 mg | DELAYED_RELEASE_TABLET | Freq: Two times a day (BID) | ORAL | 2 refills | Status: DC

## 2019-12-25 ENCOUNTER — Ambulatory Visit: Payer: Self-pay

## 2019-12-26 ENCOUNTER — Ambulatory Visit: Payer: 59 | Admitting: Internal Medicine

## 2019-12-26 ENCOUNTER — Other Ambulatory Visit: Payer: Self-pay

## 2019-12-26 ENCOUNTER — Encounter: Payer: Self-pay | Admitting: Internal Medicine

## 2019-12-26 VITALS — BP 116/80 | HR 59 | Temp 97.9°F | Ht 65.6 in | Wt 202.2 lb

## 2019-12-26 DIAGNOSIS — Z6833 Body mass index (BMI) 33.0-33.9, adult: Secondary | ICD-10-CM | POA: Diagnosis not present

## 2019-12-26 DIAGNOSIS — L819 Disorder of pigmentation, unspecified: Secondary | ICD-10-CM

## 2019-12-26 DIAGNOSIS — E6609 Other obesity due to excess calories: Secondary | ICD-10-CM

## 2019-12-26 DIAGNOSIS — Z23 Encounter for immunization: Secondary | ICD-10-CM

## 2019-12-26 NOTE — Progress Notes (Signed)
I,Katawbba Wiggins,acting as a Education administrator for Maximino Greenland, MD.,have documented all relevant documentation on the behalf of Maximino Greenland, MD,as directed by  Maximino Greenland, MD while in the presence of Maximino Greenland, MD.  This visit occurred during the SARS-CoV-2 public health emergency.  Safety protocols were in place, including screening questions prior to the visit, additional usage of staff PPE, and extensive cleaning of exam room while observing appropriate contact time as indicated for disinfecting solutions.  Subjective:     Patient ID: Kayla [name] , female    DOB: 03/30/1965 , 54 y.o.   MRN: [id number]   Chief Complaint  Patient presents with  . faded, indented area on lower left side of back    HPI  The patient is here for evaluation of faded skin on buttocks. She reports there is an indentation in this area as well. She is not experiencing any discomfort.     Past Medical History:  Diagnosis Date  . Arthritis   . Chronic back pain   . Chronic neck pain   . Chronic shoulder pain    left  . Knee pain   . Migraine   . Sciatica      Family History  Problem Relation Age of Onset  . Cancer Mother   . Cancer Father      Current Outpatient Medications:  .  azelastine (ASTELIN) 0.1 % nasal spray, Place 2 sprays into both nostrils 2 (two) times daily., Disp: 30 mL, Rfl: 5 .  cetirizine (ZYRTEC) 10 MG tablet, Take 1 tablet (10 mg total) by mouth daily., Disp: 30 tablet, Rfl: 5 .  diclofenac (VOLTAREN) 75 MG EC tablet, Take 1 tablet (75 mg total) by mouth 2 (two) times daily., Disp: 50 tablet, Rfl: 2 .  levocetirizine (XYZAL) 5 MG tablet, TAKE 1 TABLET(5 MG) BY MOUTH EVERY EVENING, Disp: 30 tablet, Rfl: 5 .  Linaclotide (LINZESS) 290 MCG CAPS capsule, Take 290 mcg by mouth daily., Disp: , Rfl:  .  olopatadine (PATANOL) 0.1 % ophthalmic solution, Place 1 drop into both eyes 2 (two) times daily., Disp: 5 mL, Rfl: 5 .  ondansetron (ZOFRAN) 8 MG tablet, Take 4 mg by  mouth every 8 (eight) hours as needed for nausea or vomiting., Disp: , Rfl:  .  triamcinolone (NASACORT) 55 MCG/ACT AERO nasal inhaler, Place 2 sprays into the nose daily., Disp: 16.5 g, Rfl: 5 .  Vitamin D, Ergocalciferol, (DRISDOL) 1.25 MG (50000 UNIT) CAPS capsule, TAKE ONE CAPSULE BY MOUTH TWICE WEEKLY( TUESDAY AND FRIDAY), Disp: 26 capsule, Rfl: 1 .  albuterol (PROVENTIL) (2.5 MG/3ML) 0.083% nebulizer solution, Take 3 mLs (2.5 mg total) by nebulization every 6 (six) hours as needed for wheezing or shortness of breath. (Patient not taking: Reported on 12/26/2019), Disp: 75 mL, Rfl: 2 .  albuterol (VENTOLIN HFA) 108 (90 Base) MCG/ACT inhaler, INHALE 2 PUFFS INTO THE LUNGS EVERY 6 HOURS AS NEEDED FOR WHEEZING OR SHORTNESS OF BREATH (Patient not taking: Reported on 12/26/2019), Disp: 54 g, Rfl: 2 .  diclofenac Sodium (VOLTAREN) 1 % GEL, Apply 2 g topically 4 (four) times daily. (Patient not taking: Reported on 12/26/2019), Disp: 100 g, Rfl: 2   Allergies  Allergen Reactions  . Codeine Nausea And Vomiting     Review of Systems  Constitutional: Negative.   Respiratory: Negative.   Cardiovascular: Negative.   Gastrointestinal: Negative.   Skin: Positive for color change (lower left side of back).  Psychiatric/Behavioral: Negative.   All other  systems reviewed and are negative.    Today's Vitals   12/26/19 1504  BP: 116/80  Pulse: (!) 59  Temp: 97.9 F (36.6 C)  TempSrc: Oral  Weight: 202 lb 3.2 oz (91.7 kg)  Height: 5' 5.6" (1.666 m)   Body mass index is 33.04 kg/m.   Wt Readings from Last 3 Encounters:  12/26/19 202 lb 3.2 oz (91.7 kg)  12/19/19 198 lb 3.2 oz (89.9 kg)  11/06/19 206 lb 9.6 oz (93.7 kg)    Objective:  Physical Exam Vitals and nursing note reviewed.  Constitutional:      Appearance: Normal appearance. She is obese.  HENT:     Head: Normocephalic and atraumatic.  Cardiovascular:     Rate and Rhythm: Normal rate and regular rhythm.     Heart sounds:  Normal heart sounds.  Pulmonary:     Breath sounds: Normal breath sounds.  Skin:    General: Skin is warm.     Comments: Area of hypopigmented skin on left upper buttock with indentation. No vesicular lesions or erythema noted.   Neurological:     General: No focal deficit present.     Mental Status: She is alert and oriented to person, place, and time.         Assessment And Plan:     1. Hypopigmented skin lesion Comments: Pt advised this is likely due to steroid injection. She does admit to haviing these in the past for URI or other issues. She is reassured this should resolve w/ time. The color should return; however, the indentation may remain. All questions were answered to her satisfaction.   2. Class 1 obesity due to excess calories without serious comorbidity with body mass index (BMI) of 33.0 to 33.9 in adult Comments: She was congratulated on her weight loss thus far and encouraged to keep up the great work. Advised to aim for BMI less than 30 to decrease cardiac risk.   3. Need for vaccination Comments: She was given flu vaccine.  - Flu Vaccine QUAD 6+ mos PF IM (Fluarix Quad PF)     Patient was given opportunity to ask questions. Patient verbalized understanding of the plan and was able to repeat key elements of the plan. All questions were answered to their satisfaction.  Maximino Greenland, MD   I, Maximino Greenland, MD, have reviewed all documentation for this visit. The documentation on 01/06/20 for the exam, diagnosis, procedures, and orders are all accurate and complete.  THE PATIENT IS ENCOURAGED TO PRACTICE SOCIAL DISTANCING DUE TO THE COVID-19 PANDEMIC.

## 2019-12-26 NOTE — Progress Notes (Signed)
Subjective:   Patient ID: Kayla [name], female   DOB: 54 y.o.   MRN: [id number]   HPI Patient states that she developed pain on the inside of her right ankle for the last for 5 days and its been hard for her to walk and also she has nail disease that she is concerned about and wants to discuss   ROS      Objective:  Physical Exam  Vascular status intact with exquisite discomfort of the posterior tibial tendon as it comes under the medial malleolus right with no indications of muscle strength loss or other pathology with patient found to have nail disease bilateral with thickness and dystrophic changes     Assessment:  Posterior tibial tendinitis right with inflammation fluid with flatfoot deformity obesity is complicating factor with nail disease bilateral with thick yellow brittle nailbeds     Plan:  H&P reviewed both conditions and for the posterior tibial tendon I did inject that she 3 mg Dexasone Kenalog 5 mg Xylocaine advised on elevation of the arch and dispensed fascial brace to lift the arch.  For the nails I recommended topical medicine discussed oral medicine or possible laser but will hold off currently  X-rays indicate that there is no indications of collapse medial longitudinal arch of a significant nature with moderate changes and no other pathology

## 2019-12-31 ENCOUNTER — Ambulatory Visit: Payer: 59 | Admitting: Podiatry

## 2020-01-07 ENCOUNTER — Other Ambulatory Visit: Payer: Self-pay

## 2020-01-07 ENCOUNTER — Ambulatory Visit
Admission: RE | Admit: 2020-01-07 | Discharge: 2020-01-07 | Disposition: A | Discharge status: Home / Self Care | Payer: 59 | Source: Ambulatory Visit | Attending: Internal Medicine | Admitting: Internal Medicine

## 2020-01-07 ENCOUNTER — Ambulatory Visit (INDEPENDENT_AMBULATORY_CARE_PROVIDER_SITE_OTHER): Payer: 59 | Admitting: *Deleted

## 2020-01-07 DIAGNOSIS — Z1231 Encounter for screening mammogram for malignant neoplasm of breast: Secondary | ICD-10-CM

## 2020-01-07 DIAGNOSIS — J309 Allergic rhinitis, unspecified: Secondary | ICD-10-CM

## 2020-01-22 ENCOUNTER — Ambulatory Visit (INDEPENDENT_AMBULATORY_CARE_PROVIDER_SITE_OTHER): Payer: 59 | Admitting: *Deleted

## 2020-01-22 DIAGNOSIS — J309 Allergic rhinitis, unspecified: Secondary | ICD-10-CM | POA: Diagnosis not present

## 2020-01-31 ENCOUNTER — Ambulatory Visit (INDEPENDENT_AMBULATORY_CARE_PROVIDER_SITE_OTHER): Payer: 59

## 2020-01-31 DIAGNOSIS — J309 Allergic rhinitis, unspecified: Secondary | ICD-10-CM

## 2020-02-11 ENCOUNTER — Telehealth: Payer: Self-pay | Admitting: *Deleted

## 2020-02-11 ENCOUNTER — Ambulatory Visit (INDEPENDENT_AMBULATORY_CARE_PROVIDER_SITE_OTHER): Payer: 59 | Admitting: *Deleted

## 2020-02-11 ENCOUNTER — Ambulatory Visit: Payer: 59 | Admitting: Neurology

## 2020-02-11 DIAGNOSIS — J309 Allergic rhinitis, unspecified: Secondary | ICD-10-CM | POA: Diagnosis not present

## 2020-02-11 NOTE — Telephone Encounter (Signed)
No showed follow up appointment. 

## 2020-02-12 ENCOUNTER — Encounter: Payer: Self-pay | Admitting: Neurology

## 2020-03-17 DIAGNOSIS — J301 Allergic rhinitis due to pollen: Secondary | ICD-10-CM | POA: Diagnosis not present

## 2020-03-17 NOTE — Progress Notes (Signed)
VIALS EXP 03-18-21 

## 2020-03-20 ENCOUNTER — Ambulatory Visit: Payer: 59 | Admitting: Internal Medicine

## 2020-03-24 DIAGNOSIS — J3081 Allergic rhinitis due to animal (cat) (dog) hair and dander: Secondary | ICD-10-CM | POA: Diagnosis not present

## 2020-03-24 NOTE — Progress Notes (Signed)
ADDITIONAL LABELS FOR BILLING 

## 2020-03-25 DIAGNOSIS — J3089 Other allergic rhinitis: Secondary | ICD-10-CM

## 2020-03-25 NOTE — Progress Notes (Signed)
ADDITIONAL LABELS FOR BILLING 

## 2020-04-23 ENCOUNTER — Other Ambulatory Visit: Payer: Self-pay | Admitting: Allergy & Immunology

## 2020-04-24 NOTE — Telephone Encounter (Signed)
Left message for patient (per DPR) letting her know a courtesy refill was given for cetirizine. An office visit is due and to call office to make an appointment.

## 2020-04-27 ENCOUNTER — Other Ambulatory Visit: Payer: Self-pay | Admitting: Internal Medicine

## 2020-10-15 ENCOUNTER — Other Ambulatory Visit: Payer: Self-pay

## 2020-10-15 ENCOUNTER — Ambulatory Visit (INDEPENDENT_AMBULATORY_CARE_PROVIDER_SITE_OTHER): Payer: Managed Care, Other (non HMO) | Admitting: Nurse Practitioner

## 2020-10-15 ENCOUNTER — Encounter: Payer: Self-pay | Admitting: Nurse Practitioner

## 2020-10-15 VITALS — BP 116/78 | HR 80 | Temp 98.6°F | Ht 65.6 in | Wt 191.4 lb

## 2020-10-15 DIAGNOSIS — Z23 Encounter for immunization: Secondary | ICD-10-CM

## 2020-10-15 DIAGNOSIS — E6609 Other obesity due to excess calories: Secondary | ICD-10-CM

## 2020-10-15 DIAGNOSIS — Z6833 Body mass index (BMI) 33.0-33.9, adult: Secondary | ICD-10-CM

## 2020-10-15 DIAGNOSIS — Z0000 Encounter for general adult medical examination without abnormal findings: Secondary | ICD-10-CM | POA: Diagnosis not present

## 2020-10-15 DIAGNOSIS — E559 Vitamin D deficiency, unspecified: Secondary | ICD-10-CM

## 2020-10-15 DIAGNOSIS — Z1231 Encounter for screening mammogram for malignant neoplasm of breast: Secondary | ICD-10-CM | POA: Diagnosis not present

## 2020-10-15 DIAGNOSIS — E0590 Thyrotoxicosis, unspecified without thyrotoxic crisis or storm: Secondary | ICD-10-CM

## 2020-10-15 NOTE — Progress Notes (Signed)
I,Katawbba Wiggins,acting as a Education administrator for [employer], NP.,have documented all relevant documentation on the behalf of [employer], NP,as directed by  Bary Castilla, NP while in the presence of Bary Castilla, NP.  This visit occurred during the SARS-CoV-2 public health emergency.  Safety protocols were in place, including screening questions prior to the visit, additional usage of staff PPE, and extensive cleaning of exam room while observing appropriate contact time as indicated for disinfecting solutions.  Subjective:     Patient ID: Kayla [name] , female    DOB: 1965/03/20 , 55 y.o.   MRN: [id number]   Chief Complaint  Patient presents with   Annual Exam     HPI  The patient is here for a physical, the patient goes to [facility], last pap was 2020. Post menomeasal  Drink: Occasional.  Smoking: No  Diet: She doesn't have a big appetite  Exercise: She has been trying to do some light exercise at home.    Other Pertinent negatives include no chest pain, chills, congestion, coughing, fever, headaches, numbness or weakness.    Past Medical History:  Diagnosis Date   Arthritis    Chronic back pain    Chronic neck pain    Chronic shoulder pain    left   Knee pain    Migraine    Sciatica      Family History  Problem Relation Age of Onset   Cancer Mother    Cancer Father      Current Outpatient Medications:    azelastine (ASTELIN) 0.1 % nasal spray, Place 2 sprays into both nostrils 2 (two) times daily., Disp: 30 mL, Rfl: 5   cetirizine (ZYRTEC) 10 MG tablet, TAKE 1 TABLET(10 MG) BY MOUTH DAILY, Disp: 30 tablet, Rfl: 0   levocetirizine (XYZAL) 5 MG tablet, TAKE 1 TABLET(5 MG) BY MOUTH EVERY EVENING, Disp: 30 tablet, Rfl: 5   linaclotide (LINZESS) 290 MCG CAPS capsule, Take 290 mcg by mouth daily., Disp: , Rfl:    metoCLOPramide (REGLAN) 10 MG tablet, 1 tab(s), Disp: , Rfl:    ondansetron (ZOFRAN) 8 MG tablet, Take 4 mg by mouth every 8  (eight) hours as needed for nausea or vomiting., Disp: , Rfl:    Vitamin D, Ergocalciferol, (DRISDOL) 1.25 MG (50000 UNIT) CAPS capsule, TAKE ONE CAPSULE BY MOUTH TWICE WEEKLY( TUESDAY AND FRIDAY), Disp: 26 capsule, Rfl: 1   albuterol (PROVENTIL) (2.5 MG/3ML) 0.083% nebulizer solution, Take 3 mLs (2.5 mg total) by nebulization every 6 (six) hours as needed for wheezing or shortness of breath. (Patient not taking: No sig reported), Disp: 75 mL, Rfl: 2   albuterol (VENTOLIN HFA) 108 (90 Base) MCG/ACT inhaler, INHALE 2 PUFFS INTO THE LUNGS EVERY 6 HOURS AS NEEDED FOR WHEEZING OR SHORTNESS OF BREATH (Patient not taking: No sig reported), Disp: 54 g, Rfl: 2   triamcinolone (NASACORT) 55 MCG/ACT AERO nasal inhaler, Place 2 sprays into the nose daily. (Patient not taking: Reported on 10/15/2020), Disp: 16.5 g, Rfl: 5   Allergies  Allergen Reactions   Codeine Nausea And Vomiting      The patient states she uses none for birth control. Last LMP was No LMP recorded (lmp unknown). Patient is postmenopausal.. Negative for Dysmenorrhea. Negative for: breast discharge, breast lump(s), breast pain and breast self exam. Associated symptoms include abnormal vaginal bleeding. Pertinent negatives include abnormal bleeding (hematology), anxiety, decreased libido, depression, difficulty falling sleep, dyspareunia, history of infertility, nocturia, sexual dysfunction, sleep disturbances, urinary incontinence, urinary urgency, vaginal  discharge and vaginal itching. Diet regular.The patient states her exercise level is    . The patient's tobacco use is:  Social History   Tobacco Use  Smoking Status Former   Packs/day: 0.00   Years: 34.00   Pack years: 0.00   Types: Cigarettes  Smokeless Tobacco Never  . She has been exposed to passive smoke. The patient's alcohol use is:  Social History   Substance and Sexual Activity  Alcohol Use Yes   Alcohol/week: 1.0 standard drink   Types: 1 Glasses of wine per week    Comment: occasionally  . Additional information: Last pap 2020, next one scheduled for 2023.    Review of Systems  Constitutional: Negative.  Negative for chills and fever.  HENT: Negative.  Negative for congestion, sinus pressure and sinus pain.   Eyes: Negative.   Respiratory: Negative.  Negative for cough, shortness of breath and wheezing.   Cardiovascular: Negative.  Negative for chest pain and palpitations.  Gastrointestinal: Negative.  Negative for constipation and diarrhea.  Endocrine: Negative.  Negative for polydipsia, polyphagia and polyuria.  Genitourinary: Negative.   Musculoskeletal: Negative.   Skin: Negative.   Allergic/Immunologic: Negative.   Neurological: Negative.  Negative for dizziness, weakness, numbness and headaches.  Hematological: Negative.   Psychiatric/Behavioral: Negative.      Today's Vitals   10/15/20 0859  BP: 116/78  Pulse: 80  Temp: 98.6 F (37 C)  TempSrc: Oral  Weight: 191 lb 6.4 oz (86.8 kg)  Height: 5' 5.6" (1.666 m)   Body mass index is 31.27 kg/m.  Wt Readings from Last 3 Encounters:  10/15/20 191 lb 6.4 oz (86.8 kg)  12/26/19 202 lb 3.2 oz (91.7 kg)  12/19/19 198 lb 3.2 oz (89.9 kg)    BP Readings from Last 3 Encounters:  10/15/20 116/78  12/26/19 116/80  12/19/19 128/84    Objective:  Physical Exam Vitals and nursing note reviewed.  Constitutional:      Appearance: Normal appearance.  HENT:     Head: Normocephalic and atraumatic.     Right Ear: Tympanic membrane, ear canal and external ear normal.     Left Ear: Tympanic membrane, ear canal and external ear normal.     Nose: Nose normal. No congestion.     Mouth/Throat:     Mouth: Mucous membranes are moist.     Pharynx: Oropharynx is clear.  Eyes:     Extraocular Movements: Extraocular movements intact.     Conjunctiva/sclera: Conjunctivae normal.     Pupils: Pupils are equal, round, and reactive to light.  Cardiovascular:     Rate and Rhythm: Normal rate and regular  rhythm.     Pulses: Normal pulses.     Heart sounds: Normal heart sounds.  Pulmonary:     Effort: Pulmonary effort is normal. No respiratory distress.     Breath sounds: Normal breath sounds. No wheezing.  Abdominal:     General: Abdomen is flat. Bowel sounds are normal.     Palpations: Abdomen is soft.  Genitourinary:    Comments: deferred Musculoskeletal:        General: Normal range of motion.     Cervical back: Normal range of motion and neck supple.  Skin:    General: Skin is warm and dry.     Capillary Refill: Capillary refill takes less than 2 seconds.  Neurological:     General: No focal deficit present.     Mental Status: She is alert and oriented to person, place, and  time.  Psychiatric:        Mood and Affect: Mood normal.        Behavior: Behavior normal.        Assessment And Plan:     1. Encounter for general adult medical examination w/o abnormal findings --Patient is here for their annual physical exam and we discussed any changes to medication and medical history.  -Behavior modification was discussed as well as diet and exercise history  -Patient will continue to exercise regularly and modify their diet.  -Recommendation for yearly physical annuals, immunization and screenings including mammogram and colonoscopy were discussed with the patient.  -Recommended intake of multivitamin, vitamin D and calcium.  -Individualized advise was given to the patient pertaining to their own health history in regards to diet, exercise, medical condition and referrals.  - CBC - CMP14+EGFR - Lipid panel - Hemoglobin A1c  2. Subclinical hyperthyroidism -She has a history of hyperthyroidism; she was seeing a endocrinologist but she has not followed up with them in a long time.  - TSH + free T4  3. Immunization due - Pneumococcal polysaccharide vaccine 23-valent greater than or equal to 2yo subcutaneous/IM  4. Vitamin D deficiency -Will check and supplement if needed.  Advised patient to spend atleast 15 min. Daily in sunlight.  - VITAMIN D 25 Hydroxy (Vit-D Deficiency, Fractures)  5. Screening mammogram for breast cancer - MM Digital Screening; Future  6. Class 1 obesity due to excess calories without serious comorbidity with body mass index (BMI) of 33.0 to 33.9 in adult Advised patient on a healthy diet including avoiding fast food and red meats. Increase the intake of lean meats including grilled chicken and [place].  Drink a lot of water. Decrease intake of fatty foods. Exercise for 30-45 min. 4-5 a week to decrease the risk of cardiac event.   Follow up: if symptoms persist or do not get better.   The patient was encouraged to call or send a message through [place] for any questions or concerns.   Side effects and appropriate use of all the medication(s) were discussed with the patient today. Patient advised to use the medication(s) as directed by their healthcare provider. The patient was encouraged to read, review, and understand all associated package inserts and contact our office with any questions or concerns. The patient accepts the risks of the treatment plan and had an opportunity to ask questions.   Staying healthy and adopting a healthy lifestyle for your overall health is important. You should eat 7 or more servings of fruits and vegetables per day. You should drink plenty of water to keep yourself hydrated and your kidneys healthy. This includes about 65-80+ fluid ounces of water. Limit your intake of animal fats especially for elevated cholesterol. Avoid highly processed food and limit your salt intake if you have hypertension. Avoid foods high in saturated/Trans fats. Along with a healthy diet it is also very important to maintain time for yourself to maintain a healthy mental health with low stress levels. You should get atleast 150 min of moderate intensity exercise weekly for a healthy heart. Along with eating right and exercising, aim for at  least 7-9 hours of sleep daily.  Eat more whole grains which includes barley, wheat berries, oats, brown rice and whole wheat pasta. Use healthy plant oils which include olive, soy, corn, sunflower and peanut. Limit your caffeine and sugary drinks. Limit your intake of fast foods. Limit milk and dairy products to one or two daily servings.  Patient was given opportunity to ask questions. Patient verbalized understanding of the plan and was able to repeat key elements of the plan. All questions were answered to their satisfaction.  Kayla Jakota Manthei, DNP   I, Kayla [name] have reviewed all documentation for this visit. The documentation on 10/15/20 for the exam, diagnosis, procedures, and orders are all accurate and complete.   THE PATIENT IS ENCOURAGED TO PRACTICE SOCIAL DISTANCING DUE TO THE COVID-19 PANDEMIC.

## 2020-10-15 NOTE — Patient Instructions (Signed)
Health Maintenance, Female Adopting a healthy lifestyle and getting preventive care are important in promoting health and wellness. Ask your health care provider about: The right schedule for you to have regular tests and exams. Things you can do on your own to prevent diseases and keep yourself healthy. What should I know about diet, weight, and exercise? Eat a healthy diet  Eat a diet that includes plenty of vegetables, fruits, low-fat dairy products, and lean protein. Do not eat a lot of foods that are high in solid fats, added sugars, or sodium.  Maintain a healthy weight Body mass index (BMI) is used to identify weight problems. It estimates body fat based on height and weight. Your health care provider can help determineyour BMI and help you achieve or maintain a healthy weight. Get regular exercise Get regular exercise. This is one of the most important things you can do for your health. Most adults should: Exercise for at least 150 minutes each week. The exercise should increase your heart rate and make you sweat (moderate-intensity exercise). Do strengthening exercises at least twice a week. This is in addition to the moderate-intensity exercise. Spend less time sitting. Even light physical activity can be beneficial. Watch cholesterol and blood lipids Have your blood tested for lipids and cholesterol at 54 years of age, then havethis test every 5 years. Have your cholesterol levels checked more often if: Your lipid or cholesterol levels are high. You are older than 54 years of age. You are at high risk for heart disease. What should I know about cancer screening? Depending on your health history and family history, you may need to have cancer screening at various ages. This may include screening for: Breast cancer. Cervical cancer. Colorectal cancer. Skin cancer. Lung cancer. What should I know about heart disease, diabetes, and high blood pressure? Blood pressure and heart  disease High blood pressure causes heart disease and increases the risk of stroke. This is more likely to develop in people who have high blood pressure readings, are of African descent, or are overweight. Have your blood pressure checked: Every 3-5 years if you are 18-39 years of age. Every year if you are 40 years old or older. Diabetes Have regular diabetes screenings. This checks your fasting blood sugar level. Have the screening done: Once every three years after age 40 if you are at a normal weight and have a low risk for diabetes. More often and at a younger age if you are overweight or have a high risk for diabetes. What should I know about preventing infection? Hepatitis B If you have a higher risk for hepatitis B, you should be screened for this virus. Talk with your health care provider to find out if you are at risk forhepatitis B infection. Hepatitis C Testing is recommended for: Everyone born from 1945 through 1965. Anyone with known risk factors for hepatitis C. Sexually transmitted infections (STIs) Get screened for STIs, including gonorrhea and chlamydia, if: You are sexually active and are younger than 54 years of age. You are older than 54 years of age and your health care provider tells you that you are at risk for this type of infection. Your sexual activity has changed since you were last screened, and you are at increased risk for chlamydia or gonorrhea. Ask your health care provider if you are at risk. Ask your health care provider about whether you are at high risk for HIV. Your health care provider may recommend a prescription medicine to help   prevent HIV infection. If you choose to take medicine to prevent HIV, you should first get tested for HIV. You should then be tested every 3 months for as long as you are taking the medicine. Pregnancy If you are about to stop having your period (premenopausal) and you may become pregnant, seek counseling before you get  pregnant. Take 400 to 800 micrograms (mcg) of folic acid every day if you become pregnant. Ask for birth control (contraception) if you want to prevent pregnancy. Osteoporosis and menopause Osteoporosis is a disease in which the bones lose minerals and strength with aging. This can result in bone fractures. If you are 65 years old or older, or if you are at risk for osteoporosis and fractures, ask your health care provider if you should: Be screened for bone loss. Take a calcium or vitamin D supplement to lower your risk of fractures. Be given hormone replacement therapy (HRT) to treat symptoms of menopause. Follow these instructions at home: Lifestyle Do not use any products that contain nicotine or tobacco, such as cigarettes, e-cigarettes, and chewing tobacco. If you need help quitting, ask your health care provider. Do not use street drugs. Do not share needles. Ask your health care provider for help if you need support or information about quitting drugs. Alcohol use Do not drink alcohol if: Your health care provider tells you not to drink. You are pregnant, may be pregnant, or are planning to become pregnant. If you drink alcohol: Limit how much you use to 0-1 drink a day. Limit intake if you are breastfeeding. Be aware of how much alcohol is in your drink. In the [place] one drink equals one 12 oz bottle of beer (355 mL), one 5 oz glass of wine (148 mL), or one 1 oz glass of hard liquor (44 mL). General instructions Schedule regular health, dental, and eye exams. Stay current with your vaccines. Tell your health care provider if: You often feel depressed. You have ever been abused or do not feel safe at home. Summary Adopting a healthy lifestyle and getting preventive care are important in promoting health and wellness. Follow your health care provider's instructions about healthy diet, exercising, and getting tested or screened for diseases. Follow your health care provider's  instructions on monitoring your cholesterol and blood pressure. This information is not intended to replace advice given to you by your health care provider. Make sure you discuss any questions you have with your healthcare provider. Document Revised: 02/22/2018 Document Reviewed: 02/22/2018 Elsevier Patient Education  2022 Elsevier Inc.  

## 2020-10-16 LAB — CMP14+EGFR
ALT: 9 IU/L (ref 0–32)
AST: 10 IU/L (ref 0–40)
Albumin/Globulin Ratio: 1.7 (ref 1.2–2.2)
Albumin: 4.4 g/dL (ref 3.8–4.9)
Alkaline Phosphatase: 67 IU/L (ref 44–121)
BUN/Creatinine Ratio: 11 (ref 9–23)
BUN: 9 mg/dL (ref 6–24)
Bilirubin Total: 0.2 mg/dL (ref 0.0–1.2)
CO2: 24 mmol/L (ref 20–29)
Calcium: 10 mg/dL (ref 8.7–10.2)
Chloride: 104 mmol/L (ref 96–106)
Creatinine, Ser: 0.81 mg/dL (ref 0.57–1.00)
Globulin, Total: 2.6 g/dL (ref 1.5–4.5)
Glucose: 83 mg/dL (ref 65–99)
Potassium: 4.4 mmol/L (ref 3.5–5.2)
Sodium: 142 mmol/L (ref 134–144)
Total Protein: 7 g/dL (ref 6.0–8.5)
eGFR: 87 mL/min/{1.73_m2} (ref >59)

## 2020-10-16 LAB — TSH+FREE T4
Free T4: 1.07 ng/dL (ref 0.82–1.77)
TSH: 0.535 u[IU]/mL (ref 0.450–4.500)

## 2020-10-16 LAB — LIPID PANEL
Chol/HDL Ratio: 2.7 ratio (ref 0.0–4.4)
Cholesterol, Total: 205 mg/dL — ABNORMAL HIGH (ref 100–199)
HDL: 75 mg/dL (ref >39)
LDL Chol Calc (NIH): 120 mg/dL — ABNORMAL HIGH (ref 0–99)
Triglycerides: 56 mg/dL (ref 0–149)
VLDL Cholesterol Cal: 10 mg/dL (ref 5–40)

## 2020-10-16 LAB — CBC
Hematocrit: 40.2 % (ref 34.0–46.6)
Hemoglobin: 12.8 g/dL (ref 11.1–15.9)
MCH: 25.8 pg — ABNORMAL LOW (ref 26.6–33.0)
MCHC: 31.8 g/dL (ref 31.5–35.7)
MCV: 81 fL (ref 79–97)
Platelets: 279 10*3/uL (ref 150–450)
RBC: 4.96 x10E6/uL (ref 3.77–5.28)
RDW: 14.1 % (ref 11.7–15.4)
WBC: 2.9 10*3/uL — ABNORMAL LOW (ref 3.4–10.8)

## 2020-10-16 LAB — HEMOGLOBIN A1C
Est. average glucose Bld gHb Est-mCnc: 108 mg/dL
Hgb A1c MFr Bld: 5.4 % (ref 4.8–5.6)

## 2020-10-16 LAB — VITAMIN D 25 HYDROXY (VIT D DEFICIENCY, FRACTURES): Vit D, 25-Hydroxy: 35.4 ng/mL (ref 30.0–100.0)

## 2020-10-21 ENCOUNTER — Encounter: Payer: Self-pay | Admitting: Internal Medicine

## 2020-10-21 ENCOUNTER — Other Ambulatory Visit: Payer: Self-pay | Admitting: Nurse Practitioner

## 2020-10-21 DIAGNOSIS — E782 Mixed hyperlipidemia: Secondary | ICD-10-CM

## 2020-10-21 DIAGNOSIS — E7800 Pure hypercholesterolemia, unspecified: Secondary | ICD-10-CM

## 2020-10-21 MED ORDER — ATORVASTATIN CALCIUM 10 MG PO TABS: 10.0000 mg | ORAL_TABLET | Freq: Every day | ORAL | 2 refills | Status: DC

## 2020-11-06 ENCOUNTER — Encounter: Payer: 59 | Admitting: Internal Medicine

## 2021-05-06 ENCOUNTER — Other Ambulatory Visit: Payer: Self-pay

## 2021-05-06 ENCOUNTER — Encounter: Payer: Self-pay | Admitting: Internal Medicine

## 2021-05-06 ENCOUNTER — Ambulatory Visit: Payer: Managed Care, Other (non HMO) | Admitting: Internal Medicine

## 2021-05-06 VITALS — BP 122/80 | HR 81 | Temp 98.6°F | Ht 65.6 in | Wt 210.0 lb

## 2021-05-06 DIAGNOSIS — J302 Other seasonal allergic rhinitis: Secondary | ICD-10-CM

## 2021-05-06 DIAGNOSIS — E559 Vitamin D deficiency, unspecified: Secondary | ICD-10-CM

## 2021-05-06 DIAGNOSIS — E7800 Pure hypercholesterolemia, unspecified: Secondary | ICD-10-CM

## 2021-05-06 DIAGNOSIS — Z6834 Body mass index (BMI) 34.0-34.9, adult: Secondary | ICD-10-CM

## 2021-05-06 DIAGNOSIS — E66811 Obesity, class 1: Secondary | ICD-10-CM

## 2021-05-06 DIAGNOSIS — J3089 Other allergic rhinitis: Secondary | ICD-10-CM

## 2021-05-06 DIAGNOSIS — E6609 Other obesity due to excess calories: Secondary | ICD-10-CM

## 2021-05-06 MED ORDER — MONTELUKAST SODIUM 10 MG PO TABS: 10.0000 mg | ORAL_TABLET | Freq: Every day | ORAL | 2 refills | Status: DC

## 2021-05-06 MED ORDER — LEVOCETIRIZINE DIHYDROCHLORIDE 5 MG PO TABS: ORAL_TABLET | ORAL | 2 refills | Status: AC

## 2021-05-06 MED ORDER — AZELASTINE HCL 0.15 % NA SOLN
1.0000 | Freq: Two times a day (BID) | NASAL | 2 refills | Status: DC | PRN
Start: 2021-05-06 — End: 2021-10-12

## 2021-05-06 NOTE — Progress Notes (Signed)
Rich Brave [place]ittleton,acting as a Education administrator for Maximino Greenland, MD.,have documented all relevant documentation on the behalf of Maximino Greenland, MD,as directed by  Maximino Greenland, MD while in the presence of Maximino Greenland, MD.  This visit occurred during the SARS-CoV-2 public health emergency.  Safety protocols were in place, including screening questions prior to the visit, additional usage of staff PPE, and extensive cleaning of exam room while observing appropriate contact time as indicated for disinfecting solutions.  Subjective:     Patient ID: Kayla [name] , female    DOB: 15-Oct-1965 , 56 y.o.   MRN: [id number]   Chief Complaint  Patient presents with   vitamin d recheck   Hyperlipidemia   Allergies    HPI  Patient presents today for a chol and vitamin d recheck. She states she is without a car, so she has been doing more walking. She wants to see if there has been any improvement. She is no longer taking atorvastatin, she experienced diarrhea when taking the medication.   Additionally, she reports her allergies have been bad and she would possibly like a referral back to the allergist. She needs refill on meds as well.   Hyperlipidemia This is a chronic problem. The current episode started more than 1 year ago. The problem is uncontrolled. Exacerbating diseases include obesity. She is currently on no antihyperlipidemic treatment.    Past Medical History:  Diagnosis Date   Arthritis    Chronic back pain    Chronic neck pain    Chronic shoulder pain    left   Knee pain    Migraine    Sciatica      Family History  Problem Relation Age of Onset   Cancer Mother    Cancer Father      Current Outpatient Medications:    albuterol (PROVENTIL) (2.5 MG/3ML) 0.083% nebulizer solution, Take 3 mLs (2.5 mg total) by nebulization every 6 (six) hours as needed for wheezing or shortness of breath., Disp: 75 mL, Rfl: 2   albuterol (VENTOLIN HFA) 108 (90 Base) MCG/ACT inhaler,  INHALE 2 PUFFS INTO THE LUNGS EVERY 6 HOURS AS NEEDED FOR WHEEZING OR SHORTNESS OF BREATH, Disp: 54 g, Rfl: 2   azelastine (ASTELIN) 0.1 % nasal spray, Place 2 sprays into both nostrils 2 (two) times daily., Disp: 30 mL, Rfl: 5   Azelastine HCl (ASTEPRO) 0.15 % SOLN, Place 1 spray into the nose 2 (two) times daily as needed., Disp: 30 mL, Rfl: 2   cetirizine (ZYRTEC) 10 MG tablet, TAKE 1 TABLET(10 MG) BY MOUTH DAILY, Disp: 30 tablet, Rfl: 0   linaclotide (LINZESS) 290 MCG CAPS capsule, Take 290 mcg by mouth daily., Disp: , Rfl:    montelukast (SINGULAIR) 10 MG tablet, Take 1 tablet (10 mg total) by mouth daily., Disp: 90 tablet, Rfl: 2   ondansetron (ZOFRAN) 8 MG tablet, Take 4 mg by mouth every 8 (eight) hours as needed for nausea or vomiting., Disp: , Rfl:    triamcinolone (NASACORT) 55 MCG/ACT AERO nasal inhaler, Place 2 sprays into the nose daily., Disp: 16.5 g, Rfl: 5   Vitamin D, Ergocalciferol, (DRISDOL) 1.25 MG (50000 UNIT) CAPS capsule, TAKE ONE CAPSULE BY MOUTH TWICE WEEKLY( TUESDAY AND FRIDAY), Disp: 26 capsule, Rfl: 1   atorvastatin (LIPITOR) 10 MG tablet, Take 1 tablet (10 mg total) by mouth daily. (Patient not taking: Reported on 05/06/2021), Disp: 30 tablet, Rfl: 2   levocetirizine (XYZAL) 5 MG tablet, TAKE 1 TABLET(5  MG) BY MOUTH EVERY EVENING, Disp: 90 tablet, Rfl: 2   Allergies  Allergen Reactions   Codeine Nausea And Vomiting     Review of Systems  Constitutional: Negative.   HENT:  Positive for postnasal drip, rhinorrhea and sneezing.        She also c/o itchy eyes.   Respiratory: Negative.    Cardiovascular: Negative.   Gastrointestinal: Negative.   Neurological: Negative.   Psychiatric/Behavioral: Negative.      Today's Vitals   05/06/21 1533  BP: 122/80  Pulse: 81  Temp: 98.6 F (37 C)  Weight: 210 lb (95.3 kg)  Height: 5' 5.6" (1.666 m)  PainSc: 0-No pain   Body mass index is 34.31 kg/m.  Wt Readings from Last 3 Encounters:  05/06/21 210 lb (95.3 kg)   10/15/20 191 lb 6.4 oz (86.8 kg)  12/26/19 202 lb 3.2 oz (91.7 kg)    Objective:  Physical Exam Vitals and nursing note reviewed.  Constitutional:      Appearance: Normal appearance. She is obese.  HENT:     Head: Normocephalic and atraumatic.     Nose:     Comments: Masked     Mouth/Throat:     Comments: Masked  Eyes:     Extraocular Movements: Extraocular movements intact.  Cardiovascular:     Rate and Rhythm: Normal rate and regular rhythm.     Heart sounds: Normal heart sounds.  Pulmonary:     Effort: Pulmonary effort is normal.     Breath sounds: Normal breath sounds.  Musculoskeletal:     Cervical back: Normal range of motion.  Skin:    General: Skin is warm.  Neurological:     General: No focal deficit present.     Mental Status: She is alert.  Psychiatric:        Mood and Affect: Mood normal.        Behavior: Behavior normal.        Assessment And Plan:     1. Pure hypercholesterolemia Comments: Chronic, last LDL 120. I will recheck lipid panel as requested. I will make further recommendations once her labs are avail for review.  - Lipid panel  2. Vitamin D deficiency Comments: I will check vitamin D level and supplement as needed.  - Vitamin D (25 hydroxy)  3. Seasonal and perennial allergic rhinitis Comments: Chronic, I will refer her to Allergy as requested. I will also send rx refills as requested. - Ambulatory referral to Allergy  4. Class 1 obesity due to excess calories with serious comorbidity and body mass index (BMI) of 34.0 to 34.9 in adult Comments: She is encouraged to strive for BMI <30 to decrease cardiac risk. Advised to aim for at least 150 minutes of exercise per week.    Patient was given opportunity to ask questions. Patient verbalized understanding of the plan and was able to repeat key elements of the plan. All questions were answered to their satisfaction.   I, Maximino Greenland, MD, have reviewed all documentation for this visit.  The documentation on 05/06/21 for the exam, diagnosis, procedures, and orders are all accurate and complete.   IF YOU HAVE BEEN REFERRED TO A SPECIALIST, IT MAY TAKE 1-2 WEEKS TO SCHEDULE/PROCESS THE REFERRAL. IF YOU HAVE NOT HEARD FROM US/SPECIALIST IN TWO WEEKS, PLEASE GIVE [place] A CALL AT [phone].   THE PATIENT IS ENCOURAGED TO PRACTICE SOCIAL DISTANCING DUE TO THE COVID-19 PANDEMIC.

## 2021-05-07 ENCOUNTER — Encounter: Payer: Self-pay | Admitting: Internal Medicine

## 2021-05-07 LAB — LIPID PANEL
Chol/HDL Ratio: 2.2 ratio (ref 0.0–4.4)
Cholesterol, Total: 247 mg/dL — ABNORMAL HIGH (ref 100–199)
HDL: 112 mg/dL (ref >39)
LDL Chol Calc (NIH): 116 mg/dL — ABNORMAL HIGH (ref 0–99)
Triglycerides: 112 mg/dL (ref 0–149)
VLDL Cholesterol Cal: 19 mg/dL (ref 5–40)

## 2021-05-07 LAB — VITAMIN D 25 HYDROXY (VIT D DEFICIENCY, FRACTURES): Vit D, 25-Hydroxy: 35.6 ng/mL (ref 30.0–100.0)

## 2021-05-28 ENCOUNTER — Other Ambulatory Visit: Payer: Self-pay | Admitting: Internal Medicine

## 2021-05-28 DIAGNOSIS — Z1231 Encounter for screening mammogram for malignant neoplasm of breast: Secondary | ICD-10-CM

## 2021-06-03 ENCOUNTER — Other Ambulatory Visit: Payer: Self-pay

## 2021-06-03 ENCOUNTER — Ambulatory Visit
Admission: RE | Admit: 2021-06-03 | Discharge: 2021-06-03 | Disposition: A | Discharge status: Home / Self Care | Payer: Managed Care, Other (non HMO) | Source: Ambulatory Visit | Attending: Internal Medicine | Admitting: Internal Medicine

## 2021-06-03 ENCOUNTER — Ambulatory Visit: Payer: Self-pay

## 2021-06-03 DIAGNOSIS — Z1231 Encounter for screening mammogram for malignant neoplasm of breast: Secondary | ICD-10-CM

## 2021-08-20 ENCOUNTER — Ambulatory Visit: Payer: Managed Care, Other (non HMO) | Admitting: Internal Medicine

## 2021-10-12 ENCOUNTER — Other Ambulatory Visit: Payer: Self-pay

## 2021-10-12 ENCOUNTER — Ambulatory Visit: Payer: Managed Care, Other (non HMO) | Admitting: Nurse Practitioner

## 2021-10-12 ENCOUNTER — Encounter: Payer: Self-pay | Admitting: Nurse Practitioner

## 2021-10-12 VITALS — BP 132/64 | HR 75 | Temp 98.3°F | Ht 65.0 in | Wt 203.8 lb

## 2021-10-12 DIAGNOSIS — J3089 Other allergic rhinitis: Secondary | ICD-10-CM | POA: Diagnosis not present

## 2021-10-12 DIAGNOSIS — R0981 Nasal congestion: Secondary | ICD-10-CM | POA: Diagnosis not present

## 2021-10-12 DIAGNOSIS — J302 Other seasonal allergic rhinitis: Secondary | ICD-10-CM

## 2021-10-12 DIAGNOSIS — J039 Acute tonsillitis, unspecified: Secondary | ICD-10-CM

## 2021-10-12 MED ORDER — MONTELUKAST SODIUM 10 MG PO TABS: 10.0000 mg | ORAL_TABLET | Freq: Every day | ORAL | 2 refills | Status: AC

## 2021-10-12 MED ORDER — ASTEPRO 205.5 MCG/SPRAY NA SOLN: 1.0000 | Freq: Two times a day (BID) | NASAL | 2 refills | Status: AC | PRN

## 2021-10-12 MED ORDER — CETIRIZINE HCL 10 MG PO TABS: ORAL_TABLET | ORAL | 0 refills | Status: DC

## 2021-10-12 MED ORDER — OLOPATADINE HCL 0.2 % OP SOLN
2.0000 [drp] | Freq: Every day | OPHTHALMIC | 5 refills | Status: AC
  Filled 2022-09-28: qty 2.5, 25d supply, fill #0

## 2021-10-12 MED ORDER — CETIRIZINE HCL 10 MG PO TABS: ORAL_TABLET | ORAL | 1 refills | Status: AC

## 2021-10-12 MED ORDER — AMOXICILLIN-POT CLAVULANATE 875-125 MG PO TABS: 1.0000 | ORAL_TABLET | Freq: Two times a day (BID) | ORAL | 0 refills | Status: AC

## 2021-10-12 NOTE — Progress Notes (Signed)
Barnet Glasgow Martin,acting as a Education administrator for Minette Brine, FNP.,have documented all relevant documentation on the behalf of Minette Brine, FNP,as directed by  Minette Brine, FNP while in the presence of Minette Brine, [place].    Subjective:     Patient ID: Kayla [name] , female    DOB: 1965/09/13 , 56 y.o.   MRN: [id number]   No chief complaint on file.   HPI  Patient states she hasn't felt good, she has been nausea and really tired since Tuesday. She had diaphoresis. She had muscle ached. She states she got really sick last Tuesday she states she felt like she had the flu for about a day. She states she thinks its the postnasal drip.  She did not take any medications other than a sinus medication.  Today she states she's just tired and congestion today.   BP Readings from Last 3 Encounters: 10/12/21 : 132/64 05/06/21 : 122/80 10/15/20 : 116/78       Past Medical History:  Diagnosis Date   Arthritis    Chronic back pain    Chronic neck pain    Chronic shoulder pain    left   Knee pain    Migraine    Sciatica      Family History  Problem Relation Age of Onset   Cancer Mother    Cancer Father      Current Outpatient Medications:    amoxicillin-clavulanate (AUGMENTIN) 875-125 MG tablet, Take 1 tablet by mouth 2 (two) times daily., Disp: 20 tablet, Rfl: 0   levocetirizine (XYZAL) 5 MG tablet, TAKE 1 TABLET(5 MG) BY MOUTH EVERY EVENING, Disp: 90 tablet, Rfl: 2   linaclotide (LINZESS) 290 MCG CAPS capsule, Take 290 mcg by mouth daily., Disp: , Rfl:    Olopatadine HCl 0.2 % SOLN, Apply 2 drops to eye daily., Disp: 2.5 mL, Rfl: 5   triamcinolone (NASACORT) 55 MCG/ACT AERO nasal inhaler, Place 2 sprays into the nose daily., Disp: 16.5 g, Rfl: 5   Vitamin D, Ergocalciferol, (DRISDOL) 1.25 MG (50000 UNIT) CAPS capsule, TAKE ONE CAPSULE BY MOUTH TWICE WEEKLY( TUESDAY AND FRIDAY), Disp: 26 capsule, Rfl: 1   albuterol (PROVENTIL) (2.5 MG/3ML) 0.083% nebulizer solution, Take 3 mLs (2.5  mg total) by nebulization every 6 (six) hours as needed for wheezing or shortness of breath. (Patient not taking: Reported on 10/12/2021), Disp: 75 mL, Rfl: 2   albuterol (VENTOLIN HFA) 108 (90 Base) MCG/ACT inhaler, INHALE 2 PUFFS INTO THE LUNGS EVERY 6 HOURS AS NEEDED FOR WHEEZING OR SHORTNESS OF BREATH (Patient not taking: Reported on 10/12/2021), Disp: 54 g, Rfl: 2   ASTEPRO 205.5 MCG/SPRAY SOLN, Place 1 spray into the nose 2 (two) times daily as needed., Disp: 30 mL, Rfl: 2   atorvastatin (LIPITOR) 10 MG tablet, Take 1 tablet (10 mg total) by mouth daily. (Patient not taking: Reported on 05/06/2021), Disp: 30 tablet, Rfl: 2   cetirizine (ZYRTEC) 10 MG tablet, TAKE 1 TABLET(10 MG) BY MOUTH DAILY, Disp: 90 tablet, Rfl: 1   montelukast (SINGULAIR) 10 MG tablet, Take 1 tablet (10 mg total) by mouth daily., Disp: 90 tablet, Rfl: 2   ondansetron (ZOFRAN) 8 MG tablet, Take 0.5 tablets (4 mg total) by mouth every 8 (eight) hours as needed for nausea or vomiting., Disp: 20 tablet, Rfl: 0   Allergies  Allergen Reactions   Codeine Nausea And Vomiting     Review of Systems  Constitutional: Negative.   HENT:  Positive for congestion.   Eyes: Negative.  Respiratory: Negative.    Cardiovascular: Negative.   Gastrointestinal: Negative.   Endocrine: Negative.   Genitourinary: Negative.   Musculoskeletal: Negative.   Skin: Negative.   Allergic/Immunologic: Negative.   Neurological: Negative.   Hematological: Negative.   Psychiatric/Behavioral: Negative.       Today's Vitals   10/12/21 1237  BP: 132/64  Pulse: 75  Temp: 98.3 F (36.8 C)  TempSrc: Oral  Weight: 203 lb 12.8 oz (92.4 kg)  Height: '5\' 5"'$  (1.651 m)  PainSc: 0-No pain   Body mass index is 33.91 kg/m.  Wt Readings from Last 3 Encounters:  10/12/21 203 lb 12.8 oz (92.4 kg)  05/06/21 210 lb (95.3 kg)  10/15/20 191 lb 6.4 oz (86.8 kg)    Objective:  Physical Exam Vitals reviewed.  Constitutional:      General: She is not in  acute distress.    Appearance: Normal appearance. She is obese.  HENT:     Head: Normocephalic.     Right Ear: Tympanic membrane, ear canal and external ear normal. There is no impacted cerumen.     Left Ear: Tympanic membrane, ear canal and external ear normal. There is no impacted cerumen.     Nose: Congestion present.     Mouth/Throat:     Mouth: Mucous membranes are moist.     Tonsils: 1+ on the right. 2+ on the left.  Cardiovascular:     Rate and Rhythm: Normal rate and regular rhythm.     Pulses: Normal pulses.     Heart sounds: Normal heart sounds. No murmur heard. Pulmonary:     Effort: Pulmonary effort is normal. No respiratory distress.     Breath sounds: Normal breath sounds. No wheezing.  Neurological:     General: No focal deficit present.     Mental Status: She is alert and oriented to person, place, and time.     Cranial Nerves: No cranial nerve deficit.     Motor: No weakness.  Psychiatric:        Mood and Affect: Mood normal.        Behavior: Behavior normal.        Thought Content: Thought content normal.        Judgment: Judgment normal.         Assessment And Plan:     1. Tonsillitis Comments: Left tonsil enlarged and erythema, will treat for tonsillitis and possible sinus infection.  - amoxicillin-clavulanate (AUGMENTIN) 875-125 MG tablet; Take 1 tablet by mouth 2 (two) times daily.  Dispense: 20 tablet; Refill: 0  2. Nasal congestion Comments: Refilled her montelukast and nasal spray.   - montelukast (SINGULAIR) 10 MG tablet; Take 1 tablet (10 mg total) by mouth daily.  Dispense: 90 tablet; Refill: 2 - cetirizine (ZYRTEC) 10 MG tablet; TAKE 1 TABLET(10 MG) BY MOUTH DAILY  Dispense: 90 tablet; Refill: 1 - ASTEPRO 205.5 MCG/SPRAY SOLN; Place 1 spray into the nose 2 (two) times daily as needed.  Dispense: 30 mL; Refill: 2 - POC COVID-19 BinaxNow - Novel Coronavirus, NAA ([facility])  3. Seasonal and perennial allergic rhinitis - Olopatadine HCl 0.2 %  SOLN; Apply 2 drops to eye daily.  Dispense: 2.5 mL; Refill: 5     Patient was given opportunity to ask questions. Patient verbalized understanding of the plan and was able to repeat key elements of the plan. All questions were answered to their satisfaction.  Minette Brine, FNP   I, Minette Brine, FNP, have reviewed all documentation for this visit.  The documentation on 10/12/21 for the exam, diagnosis, procedures, and orders are all accurate and complete.   IF YOU HAVE BEEN REFERRED TO A SPECIALIST, IT MAY TAKE 1-2 WEEKS TO SCHEDULE/PROCESS THE REFERRAL. IF YOU HAVE NOT HEARD FROM US/SPECIALIST IN TWO WEEKS, PLEASE GIVE [place] A CALL AT [phone].   THE PATIENT IS ENCOURAGED TO PRACTICE SOCIAL DISTANCING DUE TO THE COVID-19 PANDEMIC.

## 2021-10-13 ENCOUNTER — Other Ambulatory Visit: Payer: Self-pay

## 2021-10-13 ENCOUNTER — Emergency Department (HOSPITAL_COMMUNITY): Payer: Managed Care, Other (non HMO)

## 2021-10-13 ENCOUNTER — Encounter (HOSPITAL_COMMUNITY): Payer: Self-pay | Admitting: Emergency Medicine

## 2021-10-13 ENCOUNTER — Emergency Department (HOSPITAL_COMMUNITY)
Admission: EM | Admit: 2021-10-13 | Discharge: 2021-10-13 | Disposition: A | Discharge status: Home / Self Care | Payer: Managed Care, Other (non HMO) | Attending: Emergency Medicine | Admitting: Emergency Medicine

## 2021-10-13 DIAGNOSIS — R1031 Right lower quadrant pain: Secondary | ICD-10-CM | POA: Insufficient documentation

## 2021-10-13 DIAGNOSIS — R197 Diarrhea, unspecified: Secondary | ICD-10-CM | POA: Insufficient documentation

## 2021-10-13 DIAGNOSIS — R112 Nausea with vomiting, unspecified: Secondary | ICD-10-CM

## 2021-10-13 DIAGNOSIS — R1032 Left lower quadrant pain: Secondary | ICD-10-CM | POA: Diagnosis not present

## 2021-10-13 DIAGNOSIS — E860 Dehydration: Secondary | ICD-10-CM

## 2021-10-13 DIAGNOSIS — R1013 Epigastric pain: Secondary | ICD-10-CM | POA: Insufficient documentation

## 2021-10-13 LAB — URINALYSIS, ROUTINE W REFLEX MICROSCOPIC
Bilirubin Urine: NEGATIVE
Glucose, UA: NEGATIVE mg/dL
Hgb urine dipstick: NEGATIVE
Ketones, ur: 20 mg/dL — AB
Leukocytes,Ua: NEGATIVE
Nitrite: NEGATIVE
Protein, ur: NEGATIVE mg/dL
Specific Gravity, Urine: >1.046 — ABNORMAL HIGH (ref 1.005–1.030)
pH: 8 (ref 5.0–8.0)

## 2021-10-13 LAB — COMPREHENSIVE METABOLIC PANEL
ALT: 14 U/L (ref 0–44)
AST: 19 U/L (ref 15–41)
Albumin: 2.9 g/dL — ABNORMAL LOW (ref 3.5–5.0)
Alkaline Phosphatase: 43 U/L (ref 38–126)
Anion gap: 5 (ref 5–15)
BUN: 8 mg/dL (ref 6–20)
CO2: 18 mmol/L — ABNORMAL LOW (ref 22–32)
Calcium: 6.8 mg/dL — ABNORMAL LOW (ref 8.9–10.3)
Chloride: 119 mmol/L — ABNORMAL HIGH (ref 98–111)
Creatinine, Ser: 0.47 mg/dL (ref 0.44–1.00)
GFR, Estimated: >60 mL/min (ref >60)
Glucose, Bld: 104 mg/dL — ABNORMAL HIGH (ref 70–99)
Potassium: 3.5 mmol/L (ref 3.5–5.1)
Sodium: 142 mmol/L (ref 135–145)
Total Bilirubin: 0.6 mg/dL (ref 0.3–1.2)
Total Protein: 5.5 g/dL — ABNORMAL LOW (ref 6.5–8.1)

## 2021-10-13 LAB — CBC
HCT: 39.3 % (ref 36.0–46.0)
Hemoglobin: 13 g/dL (ref 12.0–15.0)
MCH: 26.6 pg (ref 26.0–34.0)
MCHC: 33.1 g/dL (ref 30.0–36.0)
MCV: 80.5 fL (ref 80.0–100.0)
Platelets: 282 10*3/uL (ref 150–400)
RBC: 4.88 MIL/uL (ref 3.87–5.11)
RDW: 14.7 % (ref 11.5–15.5)
WBC: 7.3 10*3/uL (ref 4.0–10.5)
nRBC: 0 % (ref 0.0–0.2)

## 2021-10-13 LAB — LIPASE, BLOOD: Lipase: 19 U/L (ref 11–51)

## 2021-10-13 LAB — I-STAT BETA HCG BLOOD, ED (MC, WL, AP ONLY): I-stat hCG, quantitative: <5 m[IU]/mL (ref <5)

## 2021-10-13 LAB — NOVEL CORONAVIRUS, NAA: SARS-CoV-2, NAA: NOT DETECTED

## 2021-10-13 MED ORDER — SODIUM CHLORIDE (PF) 0.9 % IJ SOLN
INTRAMUSCULAR | Status: AC
  Filled 2021-10-13: qty 50

## 2021-10-13 MED ORDER — IOHEXOL 300 MG/ML  SOLN
100.0000 mL | Freq: Once | INTRAMUSCULAR | Status: AC | PRN
  Administered 2021-10-13: 100 mL via INTRAVENOUS

## 2021-10-13 MED ORDER — MORPHINE SULFATE (PF) 4 MG/ML IV SOLN
4.0000 mg | Freq: Once | INTRAVENOUS | Status: AC
  Administered 2021-10-13: 4 mg via INTRAVENOUS
  Filled 2021-10-13: qty 1

## 2021-10-13 MED ORDER — METOCLOPRAMIDE HCL 5 MG/ML IJ SOLN
10.0000 mg | Freq: Once | INTRAMUSCULAR | Status: AC
  Administered 2021-10-13: 10 mg via INTRAVENOUS
  Filled 2021-10-13: qty 2

## 2021-10-13 MED ORDER — LACTATED RINGERS IV BOLUS
1000.0000 mL | Freq: Once | INTRAVENOUS | Status: AC
  Administered 2021-10-13: 1000 mL via INTRAVENOUS

## 2021-10-13 MED ORDER — ONDANSETRON HCL 8 MG PO TABS: 4.0000 mg | ORAL_TABLET | Freq: Three times a day (TID) | ORAL | 0 refills | Status: AC | PRN

## 2021-10-13 NOTE — ED Triage Notes (Signed)
BIBA Per EMS: Pt coming from home w/ c/o N/V & abd pain since 12am. Hx gastroparesis.  '4mg'$  zofran given en route  22 R FA  162/98 60HR 20RR 100% RA

## 2021-10-13 NOTE — ED Provider Triage Note (Signed)
Emergency Medicine Provider Triage Evaluation Note  Kayla [name] , a 56 y.o. female  was evaluated in triage.  Pt complains of n/v/adominal pain in the setting of hx of gastroparesis.  Not better with zofran.  Review of Systems  Positive: vomiting Negative: Fever or diarrhea  Physical Exam  BP (!) 155/76 (BP Location: Right Arm)   Pulse (!) 56   Temp 98.1 F (36.7 C) (Oral)   Resp 20   LMP  (LMP Unknown)   SpO2 100%  Gen:   Awake, very uncomfortable Resp:  Normal effort  MSK:   Moves extremities without difficulty  Other:  Diffuse abd pain  Medical Decision Making  Medically screening exam initiated at 11:08 AM.  Appropriate orders placed.  Kamarri Fischetti was informed that the remainder of the evaluation will be completed by another provider, this initial triage assessment does not replace that evaluation, and the importance of remaining in the ED until their evaluation is complete.     Blanchie Dessert, MD 10/13/21 1109

## 2021-10-13 NOTE — Discharge Instructions (Addendum)
Your scan today was normal.  All labs wnl except for your calcium was low and you can take 2 tums daily for the next week for that or a calcium chew.  New prescription for nausea medication was sent to your pharmacy.  If you start having severe pain, recurrent vomiting, fever or other concerns return to the emergency room.

## 2021-10-13 NOTE — ED Provider Notes (Signed)
[facility] DEPT Provider Note   CSN: [id number] Arrival date & time: 10/13/21  1036     History  Chief Complaint  Patient presents with   Abdominal Pain   Emesis   Nausea    Kayla [name] is a 56 y.o. female.  Patient is a 56 year old female with a history of gastroparesis, chronic pain who is presenting today with persistent vomiting that started last night.  She did see her PCP yesterday due to some postnasal drip and myalgias but did not have any vomiting until last night.  She reports she had some salad and some chicken and all night long she had multiple episodes of emesis.  It did appear brown in color but she denied any bright red blood.  Its been waxing and waning and she has had some diarrhea.  She has had no fevers that she is aware of.  She reports now she has epigastric pain and then pain in her lower abdomen.  She reports that this does not feel typical of her gastroparesis.  She denies any cough, shortness of breath or chest pain.  She did try taking some Zofran at home without improvement.  The last alcohol she had was greater than a week ago and she does not drink regularly.  She denies any dysuria, frequency, urgency.  No prior abdominal surgeries.  The history is provided by the patient.  Abdominal Pain Associated symptoms: vomiting   Emesis Associated symptoms: abdominal pain        Home Medications Prior to Admission medications   Medication Sig Start Date End Date Taking? Authorizing Provider  albuterol (PROVENTIL) (2.5 MG/3ML) 0.083% nebulizer solution Take 3 mLs (2.5 mg total) by nebulization every 6 (six) hours as needed for wheezing or shortness of breath. Patient not taking: Reported on 10/12/2021 03/27/19   Minette Brine, FNP  albuterol (VENTOLIN HFA) 108 (90 Base) MCG/ACT inhaler INHALE 2 PUFFS INTO THE LUNGS EVERY 6 HOURS AS NEEDED FOR WHEEZING OR SHORTNESS OF BREATH Patient not taking: Reported on 10/12/2021 04/10/19    Minette Brine, FNP  amoxicillin-clavulanate (AUGMENTIN) 875-125 MG tablet Take 1 tablet by mouth 2 (two) times daily. 10/12/21   Minette Brine, FNP  ASTEPRO 205.5 MCG/SPRAY SOLN Place 1 spray into the nose 2 (two) times daily as needed. 10/12/21   Minette Brine, FNP  atorvastatin (LIPITOR) 10 MG tablet Take 1 tablet (10 mg total) by mouth daily. Patient not taking: Reported on 05/06/2021 10/21/20 05/06/21  Bary Castilla, NP  cetirizine (ZYRTEC) 10 MG tablet TAKE 1 TABLET(10 MG) BY MOUTH DAILY 10/12/21   Minette Brine, FNP  levocetirizine (XYZAL) 5 MG tablet TAKE 1 TABLET(5 MG) BY MOUTH EVERY EVENING 05/06/21   Glendale Chard, MD  linaclotide [facility]) 290 MCG CAPS capsule Take 290 mcg by mouth daily.    [provider]  montelukast (SINGULAIR) 10 MG tablet Take 1 tablet (10 mg total) by mouth daily. 10/12/21 10/12/22  Minette Brine, FNP  Olopatadine HCl 0.2 % SOLN Apply 2 drops to eye daily. 10/12/21   Minette Brine, FNP  ondansetron (ZOFRAN) 8 MG tablet Take 0.5 tablets (4 mg total) by mouth every 8 (eight) hours as needed for nausea or vomiting. 10/13/21   Blanchie Dessert, MD  triamcinolone (NASACORT) 55 MCG/ACT AERO nasal inhaler Place 2 sprays into the nose daily. 04/19/19   Valentina Shaggy, MD  Vitamin D, Ergocalciferol, (DRISDOL) 1.25 MG (50000 UNIT) CAPS capsule TAKE ONE CAPSULE BY MOUTH TWICE WEEKLY( TUESDAY AND FRIDAY) 04/28/20  Glendale Chard, MD      Allergies    Codeine    Review of Systems   Review of Systems  Gastrointestinal:  Positive for abdominal pain and vomiting.    Physical Exam Updated Vital Signs BP 111/70 (BP Location: Left Arm)   Pulse 70   Temp 98.3 F (36.8 C) (Oral)   Resp 16   LMP  (LMP Unknown)   SpO2 100%  Physical Exam Vitals and nursing note reviewed.  Constitutional:      General: She is not in acute distress.    Appearance: She is well-developed.  HENT:     Head: Normocephalic and atraumatic.     Mouth/Throat:     Mouth: Mucous  membranes are dry.  Eyes:     Conjunctiva/sclera: Conjunctivae normal.     Pupils: Pupils are equal, round, and reactive to light.  Cardiovascular:     Rate and Rhythm: Normal rate and regular rhythm.     Heart sounds: No murmur heard. Pulmonary:     Effort: Pulmonary effort is normal. No respiratory distress.     Breath sounds: Normal breath sounds. No wheezing or rales.  Abdominal:     General: There is no distension.     Palpations: Abdomen is soft.     Tenderness: There is abdominal tenderness in the right lower quadrant, epigastric area, suprapubic area and left lower quadrant. There is no guarding or rebound.  Musculoskeletal:        General: No tenderness. Normal range of motion.     Cervical back: Normal range of motion and neck supple.     Right lower leg: No edema.     Left lower leg: No edema.  Skin:    General: Skin is warm and dry.     Findings: No erythema or rash.  Neurological:     Mental Status: She is alert and oriented to person, place, and time. Mental status is at baseline.  Psychiatric:        Mood and Affect: Mood normal.        Behavior: Behavior normal.     ED Results / Procedures / Treatments   Labs (all labs ordered are listed, but only abnormal results are displayed) Labs Reviewed  COMPREHENSIVE METABOLIC PANEL - Abnormal; Notable for the following components:      Result Value   Chloride 119 (*)    CO2 18 (*)    Glucose, Bld 104 (*)    Calcium 6.8 (*)    Total Protein 5.5 (*)    Albumin 2.9 (*)    All other components within normal limits  LIPASE, BLOOD  CBC  URINALYSIS, ROUTINE W REFLEX MICROSCOPIC  I-STAT BETA HCG BLOOD, ED ([facility], [facility], [facility] ONLY)    EKG EKG Interpretation  Date/Time:  Tuesday October 13 2021 11:40:47 EDT Ventricular Rate:  73 PR Interval:  176 QRS Duration: 115 QT Interval:  437 QTC Calculation: 485 R Axis:   69 Text Interpretation: Sinus rhythm Nonspecific intraventricular conduction delay ST elev, probable normal  early repol pattern No significant change since last tracing Confirmed by Blanchie Dessert [phone]) on 10/13/2021 1:00:48 PM  Radiology CT ABDOMEN PELVIS W CONTRAST  Result Date: 10/13/2021 CLINICAL DATA:  Abdominal pain, acute, nonlocalized. EXAM: CT ABDOMEN AND PELVIS WITH CONTRAST TECHNIQUE: Multidetector CT imaging of the abdomen and pelvis was performed using the standard protocol following bolus administration of intravenous contrast. RADIATION DOSE REDUCTION: This exam was performed according to the departmental dose-optimization program which includes  automated exposure control, adjustment of the mA and/or kV according to patient size and/or use of iterative reconstruction technique. CONTRAST:  154m OMNIPAQUE IOHEXOL 300 MG/ML  SOLN COMPARISON:  Previous nuclear medicine and MRI studies. FINDINGS: Lower chest: Normal Hepatobiliary: Liver parenchyma is normal.  No calcified gallstones. Pancreas: Normal Spleen: Normal Adrenals/Urinary Tract: Adrenal glands are normal. Left kidney is normal. 2 cm lesion in the lateral midportion of the right kidney previously evaluated by MRI and consistent with a lipid poor angiomyolipoma. Maximal diameter is increased since 2014 when it measured 1.3 cm. Bladder is normal. Stomach/Bowel: Small hiatal hernia. The small bowel is normal. Normal appendix. Normal colon. Vascular/Lymphatic: The aorta shows minimal atherosclerosis. No aneurysm. IVC is normal. No adenopathy. Reproductive: Small uterine leiomyomas.  No adnexal lesion. Other: No free fluid or air. Musculoskeletal: Lower lumbar degenerative changes. IMPRESSION: No acute finding to explain the clinical presentation. Small hiatal hernia.  No other abnormal bowel finding. 1.9 cm lesion in the midportion of the right kidney consistent with angiomyolipoma, previously evaluated by MRI. This is 6 mm larger in diameter than was seen in 2014. Lower lumbar degenerative changes. Aortic atherosclerosis, mild. Electronically  Signed   By: MNelson ChimesM.D.   On: 10/13/2021 13:55    Procedures Procedures    Medications Ordered in ED Medications  metoCLOPramide (REGLAN) injection 10 mg (10 mg Intravenous Given 10/13/21 1147)  morphine (PF) 4 MG/ML injection 4 mg (4 mg Intravenous Given 10/13/21 1146)  lactated ringers bolus 1,000 mL (0 mLs Intravenous Stopped 10/13/21 1420)  iohexol (OMNIPAQUE) 300 MG/ML solution 100 mL (100 mLs Intravenous Contrast Given 10/13/21 1332)  sodium chloride (PF) 0.9 % injection (  Given by Other 10/13/21 1335)    ED Course/ Medical Decision Making/ A&P                           Medical Decision Making Amount and/or Complexity of Data Reviewed External Data Reviewed: notes. Labs: ordered. Decision-making details documented in ED Course. Radiology: ordered and independent interpretation performed. Decision-making details documented in ED Course. ECG/medicine tests: ordered and independent interpretation performed. Decision-making details documented in ED Course.  Risk Prescription drug management.   Pt with multiple medical problems and comorbidities and presenting today with a complaint that caries a high risk for morbidity and mortality.  Here today with abdominal pain, nausea vomiting and diarrhea.  Patient is hemodynamically stable at this time.  Concern for possible flare of her gastroparesis versus viral illness versus foodborne illness versus cholecystitis, pancreatitis or enteritis.  She does not have focal findings consistent with diverticulitis or appendicitis.  Low suspicion for perforation.  Patient given IV fluids, pain and nausea control.  Labs are pending.  2:41 PM I independently interpreted patient's labs today with a negative hCG normal lipase and CBC.  CMP with normal LFTs, creatinine but hypocalcemia today at 6.8.  I have independently visualized and interpreted pt's images today.  CT without evidence of renal stones today, hydronephrosis or colitis.  Radiology reports  no acute findings.  Known lesion in the midportion of the right kidney which is consistent with the angiomyolipoma that has been evaluated in the past. 2:41 PM On repeat eval pt feeling better.  Po challenged without difficulty.  Pt stable for d/c home.          Final Clinical Impression(s) / ED Diagnoses Final diagnoses:  Nausea vomiting and diarrhea  Dehydration    Rx / DC Orders ED  Discharge Orders          Ordered    ondansetron (ZOFRAN) 8 MG tablet  Every 8 hours PRN        10/13/21 1416              Blanchie Dessert, MD 10/13/21 1441

## 2021-10-16 ENCOUNTER — Telehealth: Payer: Self-pay

## 2021-10-16 ENCOUNTER — Encounter: Payer: Self-pay | Admitting: Nurse Practitioner

## 2021-10-16 LAB — POC COVID19 BINAXNOW: SARS Coronavirus 2 Ag: NEGATIVE

## 2021-10-16 NOTE — Telephone Encounter (Signed)
Transition Care Management Unsuccessful Follow-up Telephone Call  Date of discharge and from where:  [facility] 10/13/21  Attempts:  1st Attempt  Reason for unsuccessful TCM follow-up call:  Left voice message

## 2021-10-26 ENCOUNTER — Encounter: Payer: Managed Care, Other (non HMO) | Admitting: Internal Medicine

## 2021-10-26 NOTE — Progress Notes (Deleted)
Barnet Glasgow Odarius Dines,acting as a Education administrator for Maximino Greenland, MD.,have documented all relevant documentation on the behalf of Maximino Greenland, MD,as directed by  Maximino Greenland, MD while in the presence of Maximino Greenland, MD.   Subjective:     Patient ID: Kayla [name] , female    DOB: Nov 17, 1965 , 56 y.o.   MRN: [id number]   Chief Complaint  Patient presents with   Annual Exam    HPI  Patient presents today for HM, Patient states complaint with medication. Patient doesn't have any other concerns today.     Past Medical History:  Diagnosis Date   Arthritis    Chronic back pain    Chronic neck pain    Chronic shoulder pain    left   Knee pain    Migraine    Sciatica      Family History  Problem Relation Age of Onset   Cancer Mother    Cancer Father      Current Outpatient Medications:    albuterol (PROVENTIL) (2.5 MG/3ML) 0.083% nebulizer solution, Take 3 mLs (2.5 mg total) by nebulization every 6 (six) hours as needed for wheezing or shortness of breath. (Patient not taking: Reported on 10/12/2021), Disp: 75 mL, Rfl: 2   albuterol (VENTOLIN HFA) 108 (90 Base) MCG/ACT inhaler, INHALE 2 PUFFS INTO THE LUNGS EVERY 6 HOURS AS NEEDED FOR WHEEZING OR SHORTNESS OF BREATH (Patient not taking: Reported on 10/12/2021), Disp: 54 g, Rfl: 2   amoxicillin-clavulanate (AUGMENTIN) 875-125 MG tablet, Take 1 tablet by mouth 2 (two) times daily., Disp: 20 tablet, Rfl: 0   ASTEPRO 205.5 MCG/SPRAY SOLN, Place 1 spray into the nose 2 (two) times daily as needed., Disp: 30 mL, Rfl: 2   atorvastatin (LIPITOR) 10 MG tablet, Take 1 tablet (10 mg total) by mouth daily. (Patient not taking: Reported on 05/06/2021), Disp: 30 tablet, Rfl: 2   cetirizine (ZYRTEC) 10 MG tablet, TAKE 1 TABLET(10 MG) BY MOUTH DAILY, Disp: 90 tablet, Rfl: 1   levocetirizine (XYZAL) 5 MG tablet, TAKE 1 TABLET(5 MG) BY MOUTH EVERY EVENING, Disp: 90 tablet, Rfl: 2   linaclotide (LINZESS) 290 MCG CAPS capsule, Take 290 mcg by  mouth daily., Disp: , Rfl:    montelukast (SINGULAIR) 10 MG tablet, Take 1 tablet (10 mg total) by mouth daily., Disp: 90 tablet, Rfl: 2   Olopatadine HCl 0.2 % SOLN, Apply 2 drops to eye daily., Disp: 2.5 mL, Rfl: 5   ondansetron (ZOFRAN) 8 MG tablet, Take 0.5 tablets (4 mg total) by mouth every 8 (eight) hours as needed for nausea or vomiting., Disp: 20 tablet, Rfl: 0   triamcinolone (NASACORT) 55 MCG/ACT AERO nasal inhaler, Place 2 sprays into the nose daily., Disp: 16.5 g, Rfl: 5   Vitamin D, Ergocalciferol, (DRISDOL) 1.25 MG (50000 UNIT) CAPS capsule, TAKE ONE CAPSULE BY MOUTH TWICE WEEKLY( TUESDAY AND FRIDAY), Disp: 26 capsule, Rfl: 1   Allergies  Allergen Reactions   Codeine Nausea And Vomiting      The patient states she uses {contraceptive methods:5051} for birth control. Last LMP was No LMP recorded (lmp unknown). Patient is postmenopausal.. {Dysmenorrhea-menorrhagia:21918}. Negative for: breast discharge, breast lump(s), breast pain and breast self exam. Associated symptoms include abnormal vaginal bleeding. Pertinent negatives include abnormal bleeding (hematology), anxiety, decreased libido, depression, difficulty falling sleep, dyspareunia, history of infertility, nocturia, sexual dysfunction, sleep disturbances, urinary incontinence, urinary urgency, vaginal discharge and vaginal itching. Diet regular.The patient states her exercise level is    .  The patient's tobacco use is:  Social History   Tobacco Use  Smoking Status Former   Packs/day: 0.00   Years: 34.00   Total pack years: 0.00   Types: Cigarettes  Smokeless Tobacco Never  . She has been exposed to passive smoke. The patient's alcohol use is:  Social History   Substance and Sexual Activity  Alcohol Use Yes   Alcohol/week: 1.0 standard drink of alcohol   Types: 1 Glasses of wine per week   Comment: occasionally  . Additional information: Last pap ***, next one scheduled for ***.    Review of Systems   Constitutional: Negative.   HENT: Negative.    Eyes: Negative.   Respiratory: Negative.    Cardiovascular: Negative.   Gastrointestinal: Negative.   Endocrine: Negative.   Genitourinary: Negative.   Musculoskeletal: Negative.   Skin: Negative.   Allergic/Immunologic: Negative.   Neurological: Negative.   Hematological: Negative.   Psychiatric/Behavioral: Negative.       There were no vitals filed for this visit. There is no height or weight on file to calculate BMI.   Objective:  Physical Exam      Assessment And Plan:     There are no diagnoses linked to this encounter.    Patient was given opportunity to ask questions. Patient verbalized understanding of the plan and was able to repeat key elements of the plan. All questions were answered to their satisfaction.   Tonie Griffith, [place], Tonie Griffith, CMA, have reviewed all documentation for this visit. The documentation on 10/26/21 for the exam, diagnosis, procedures, and orders are all accurate and complete.   THE PATIENT IS ENCOURAGED TO PRACTICE SOCIAL DISTANCING DUE TO THE COVID-19 PANDEMIC.

## 2021-10-31 NOTE — Progress Notes (Signed)
No show

## 2022-09-27 ENCOUNTER — Other Ambulatory Visit (HOSPITAL_COMMUNITY): Payer: Self-pay

## 2022-09-28 ENCOUNTER — Other Ambulatory Visit (HOSPITAL_COMMUNITY): Payer: Self-pay

## 2022-09-28 MED ORDER — MELOXICAM 15 MG PO TABS: 15.0000 mg | ORAL_TABLET | Freq: Every day | ORAL | 3 refills | Status: AC

## 2022-09-28 MED ORDER — CEPHALEXIN 250 MG PO CAPS: 250.0000 mg | ORAL_CAPSULE | Freq: Four times a day (QID) | ORAL | 2 refills | Status: AC

## 2022-09-28 MED ORDER — LINACLOTIDE 290 MCG PO CAPS
290.0000 ug | ORAL_CAPSULE | ORAL | 1 refills | Status: AC
  Filled 2022-09-28 – 2022-11-12 (×3): qty 90, 90d supply, fill #0

## 2022-09-29 ENCOUNTER — Other Ambulatory Visit (HOSPITAL_COMMUNITY): Payer: Self-pay

## 2022-10-06 ENCOUNTER — Other Ambulatory Visit (HOSPITAL_COMMUNITY): Payer: Self-pay

## 2022-11-10 ENCOUNTER — Other Ambulatory Visit: Payer: Self-pay | Admitting: Internal Medicine

## 2022-11-10 ENCOUNTER — Ambulatory Visit
Admission: RE | Admit: 2022-11-10 | Discharge: 2022-11-10 | Disposition: A | Discharge status: Home / Self Care | Payer: Commercial Managed Care - PPO | Source: Ambulatory Visit | Attending: Internal Medicine | Admitting: Internal Medicine

## 2022-11-10 DIAGNOSIS — Z1231 Encounter for screening mammogram for malignant neoplasm of breast: Secondary | ICD-10-CM | POA: Diagnosis not present

## 2022-11-11 DIAGNOSIS — M1711 Unilateral primary osteoarthritis, right knee: Secondary | ICD-10-CM | POA: Diagnosis not present

## 2022-11-11 DIAGNOSIS — M170 Bilateral primary osteoarthritis of knee: Secondary | ICD-10-CM | POA: Diagnosis not present

## 2022-11-11 DIAGNOSIS — M1712 Unilateral primary osteoarthritis, left knee: Secondary | ICD-10-CM | POA: Diagnosis not present

## 2022-11-12 ENCOUNTER — Other Ambulatory Visit: Payer: Self-pay

## 2022-11-12 ENCOUNTER — Other Ambulatory Visit (HOSPITAL_COMMUNITY): Payer: Self-pay

## 2022-11-16 ENCOUNTER — Other Ambulatory Visit (HOSPITAL_COMMUNITY): Payer: Self-pay

## 2022-11-17 ENCOUNTER — Other Ambulatory Visit: Payer: Self-pay

## 2022-11-17 ENCOUNTER — Other Ambulatory Visit (HOSPITAL_COMMUNITY): Payer: Self-pay

## 2022-11-17 MED ORDER — ONDANSETRON 8 MG PO TBDP
8.0000 mg | ORAL_TABLET | Freq: Three times a day (TID) | ORAL | 5 refills | Status: AC
  Filled 2022-11-17: qty 90, 30d supply, fill #0

## 2022-11-18 ENCOUNTER — Other Ambulatory Visit: Payer: Self-pay

## 2022-11-19 ENCOUNTER — Other Ambulatory Visit: Payer: Self-pay

## 2022-12-13 ENCOUNTER — Other Ambulatory Visit (HOSPITAL_COMMUNITY): Payer: Self-pay

## 2023-08-11 ENCOUNTER — Ambulatory Visit: Payer: Self-pay | Admitting: Podiatry

## 2023-08-11 ENCOUNTER — Encounter: Payer: Self-pay | Admitting: Podiatry

## 2023-08-11 ENCOUNTER — Ambulatory Visit (INDEPENDENT_AMBULATORY_CARE_PROVIDER_SITE_OTHER): Payer: Self-pay | Admitting: Podiatry

## 2023-08-11 VITALS — Ht 65.0 in | Wt 203.0 lb

## 2023-08-11 DIAGNOSIS — M7752 Other enthesopathy of left foot: Secondary | ICD-10-CM

## 2023-08-11 DIAGNOSIS — M722 Plantar fascial fibromatosis: Secondary | ICD-10-CM

## 2023-08-11 MED ORDER — TRIAMCINOLONE ACETONIDE 10 MG/ML IJ SUSP
10.0000 mg | Freq: Once | INTRAMUSCULAR | Status: AC
  Administered 2023-08-11: 10 mg via INTRA_ARTICULAR

## 2023-08-12 NOTE — Progress Notes (Signed)
 Subjective:   Patient ID: Kayla [name], female   DOB: 58 y.o.   MRN: [id number]   HPI Patient presents stating she is having several problems with 1 being a lot of pain that is developing in the right heel and secondarily she is developing some forefoot pain left that she wants to have checked.  Patient did very well from surgery of 6 years ago very pleased.  Patient does not smoke currently and likes to be active   Review of Systems  All other systems reviewed and are negative.       Objective:  Physical Exam Vitals and nursing note reviewed.  Constitutional:      Appearance: She is well-developed.  Pulmonary:     Effort: Pulmonary effort is normal.  Musculoskeletal:        General: Normal range of motion.  Skin:    General: Skin is warm.  Neurological:     Mental Status: She is alert.     Neurovascular status intact muscle strength was found to be adequate range of motion adequate with the patient noted to have severe discomfort in the plantar heel right at the insertional point tendon calcaneal with fluid buildup noted and pain to pressure.  Patient is also noted to have discomfort in the forefoot left around the lesser MPJs localized with no other pathology noted.  Good digital perfusion well-oriented     Assessment:  Inflammatory fasciitis right plantar heel along with capsulitis left     Plan:  H&P reviewed both conditions and we will get a focus on the right as it is intact.  I did do sterile prep I injected the fascia 3 mg Kenalog  5 mg Xylocaine at insertion I advised on rigid bottom shoes and will be seen back as needed with sterile dressing applied

## 2023-08-15 ENCOUNTER — Encounter: Payer: Self-pay | Admitting: Internal Medicine

## 2023-08-15 NOTE — Progress Notes (Deleted)
 I,Sujey Gundry T Basil Lim, CMA,acting as a Neurosurgeon for Smiley Dung, MD.,have documented all relevant documentation on the behalf of Smiley Dung, MD,as directed by  Smiley Dung, MD while in the presence of Smiley Dung, MD.  Subjective:  Patient ID: Kayla [name] , female    DOB: [birth year] , 58 y.o.   MRN: [id number]  No chief complaint on file.   HPI  HPI   Past Medical History:  Diagnosis Date   Arthritis    Chronic back pain    Chronic neck pain    Chronic shoulder pain    left   Knee pain    Migraine    Sciatica      Family History  Problem Relation Age of Onset   Cancer Mother    Cancer Father      Current Outpatient Medications:    amoxicillin -clavulanate (AUGMENTIN ) 875-125 MG tablet, Take 1 tablet by mouth 2 (two) times daily., Disp: 20 tablet, Rfl: 0   ASTEPRO  205.5 MCG/SPRAY SOLN, Place 1 spray into the nose 2 (two) times daily as needed., Disp: 30 mL, Rfl: 2   cephALEXin  (KEFLEX ) 250 MG capsule, Take 1 capsule (250 mg total) by mouth 4 (four) times daily., Disp: 28 capsule, Rfl: 2   cetirizine  (ZYRTEC ) 10 MG tablet, TAKE 1 TABLET(10 MG) BY MOUTH DAILY, Disp: 90 tablet, Rfl: 1   levocetirizine (XYZAL ) 5 MG tablet, TAKE 1 TABLET(5 MG) BY MOUTH EVERY EVENING, Disp: 90 tablet, Rfl: 2   linaclotide  (LINZESS ) 290 MCG CAPS capsule, Take 290 mcg by mouth daily., Disp: , Rfl:    linaclotide  (LINZESS ) 290 MCG CAPS capsule, Take 1 capsule (290 mcg total) by mouth 30 minutes before first meal of the day, Disp: 90 capsule, Rfl: 1   meloxicam  (MOBIC ) 15 MG tablet, Take 1 tablet (15 mg total) by mouth daily with food., Disp: 30 tablet, Rfl: 3   montelukast  (SINGULAIR ) 10 MG tablet, Take 1 tablet (10 mg total) by mouth daily., Disp: 90 tablet, Rfl: 2   Olopatadine  HCl 0.2 % SOLN, Apply 2 drops in affected eye(s) daily., Disp: 2.5 mL, Rfl: 5   ondansetron  (ZOFRAN ) 8 MG tablet, Take 0.5 tablets (4 mg total) by mouth every 8 (eight) hours as needed for nausea or  vomiting., Disp: 20 tablet, Rfl: 0   ondansetron  (ZOFRAN -ODT) 8 MG disintegrating tablet, Take 1 tablet (8 mg total) by mouth 3 (three) times daily., Disp: 90 tablet, Rfl: 5   triamcinolone  (NASACORT ) 55 MCG/ACT AERO nasal inhaler, Place 2 sprays into the nose daily., Disp: 16.5 g, Rfl: 5   Vitamin D , Ergocalciferol , (DRISDOL ) 1.25 MG (50000 UNIT) CAPS capsule, TAKE ONE CAPSULE BY MOUTH TWICE WEEKLY( TUESDAY AND FRIDAY), Disp: 26 capsule, Rfl: 1   Allergies  Allergen Reactions   Codeine Nausea And Vomiting     Review of Systems  Constitutional: Negative.   HENT: Negative.    Eyes: Negative.   Respiratory: Negative.    Cardiovascular: Negative.   Gastrointestinal: Negative.   Endocrine: Negative.   Genitourinary: Negative.   Musculoskeletal: Negative.   Skin: Negative.   Allergic/Immunologic: Negative.   Neurological: Negative.   Hematological: Negative.   Psychiatric/Behavioral: Negative.       There were no vitals filed for this visit. There is no height or weight on file to calculate BMI.  Wt Readings from Last 3 Encounters:  08/11/23 203 lb (92.1 kg)  10/12/21 203 lb 12.8 oz (92.4 kg)  05/06/21 210 lb (95.3 kg)  Objective:  Physical Exam      Assessment And Plan:  Encounter for annual health examination     No follow-ups on file.  Patient was given opportunity to ask questions. Patient verbalized understanding of the plan and was able to repeat key elements of the plan. All questions were answered to their satisfaction.  Smiley Dung, MD  I, Smiley Dung, MD, have reviewed all documentation for this visit. The documentation on 08/15/23 for the exam, diagnosis, procedures, and orders are all accurate and complete.   IF YOU HAVE BEEN REFERRED TO A SPECIALIST, IT MAY TAKE 1-2 WEEKS TO SCHEDULE/PROCESS THE REFERRAL. IF YOU HAVE NOT HEARD FROM US /SPECIALIST IN TWO WEEKS, PLEASE GIVE US  A CALL AT [phone].   THE PATIENT IS ENCOURAGED TO PRACTICE  SOCIAL DISTANCING DUE TO THE COVID-19 PANDEMIC.

## 2023-08-15 NOTE — Patient Instructions (Incomplete)
